# Patient Record
Sex: Male | Born: 1965
Health system: Southern US, Community
[De-identification: ages and names within clinical notes are randomized; demographics above are authoritative.]

## PROBLEM LIST (undated history)

## (undated) DIAGNOSIS — M545 Low back pain, unspecified: Secondary | ICD-10-CM

## (undated) DIAGNOSIS — T7840XA Allergy, unspecified, initial encounter: Secondary | ICD-10-CM

## (undated) DIAGNOSIS — D126 Benign neoplasm of colon, unspecified: Secondary | ICD-10-CM

## (undated) DIAGNOSIS — I10 Essential (primary) hypertension: Secondary | ICD-10-CM

## (undated) DIAGNOSIS — K635 Polyp of colon: Secondary | ICD-10-CM

## (undated) HISTORY — DX: Polyp of colon: K63.5

## (undated) HISTORY — DX: Low back pain, unspecified: M54.50

## (undated) HISTORY — PX: LUMBAR EPIDURAL INJECTION: SHX1980

## (undated) HISTORY — DX: Allergy, unspecified, initial encounter: T78.40XA

## (undated) HISTORY — DX: Low back pain: M54.5

## (undated) HISTORY — DX: Essential (primary) hypertension: I10

## (undated) HISTORY — DX: Benign neoplasm of colon, unspecified: D12.6

---

## 2006-08-05 ENCOUNTER — Ambulatory Visit: Payer: Self-pay | Admitting: Internal Medicine

## 2006-08-05 DIAGNOSIS — R300 Dysuria: Secondary | ICD-10-CM | POA: Insufficient documentation

## 2006-08-05 LAB — CONVERTED CEMR LAB
Basophils Absolute: 0 10*3/uL (ref 0.0–0.1)
Basophils Relative: 0.1 % (ref 0.0–1.0)
Eosinophil percent: 0.7 % (ref 0.0–5.0)
HCT: 47.8 % (ref 39.0–52.0)
Hemoglobin: 16.6 g/dL (ref 13.0–17.0)
Lymphocytes Relative: 14.4 % (ref 12.0–46.0)
MCHC: 34.7 g/dL (ref 30.0–36.0)
MCV: 89.7 fL (ref 78.0–100.0)
Monocytes Absolute: 0.5 10*3/uL (ref 0.2–0.7)
Monocytes Relative: 5.1 % (ref 3.0–11.0)
Neutro Abs: 7 10*3/uL (ref 1.4–7.7)
Neutrophils Relative %: 79.7 % — ABNORMAL HIGH (ref 43.0–77.0)
Platelets: 310 10*3/uL (ref 150–400)
RBC: 5.32 M/uL (ref 4.22–5.81)
RDW: 12.3 % (ref 11.5–14.6)
WBC: 8.9 10*3/uL (ref 4.5–10.5)

## 2007-02-02 ENCOUNTER — Ambulatory Visit: Payer: Self-pay | Admitting: Internal Medicine

## 2007-05-28 ENCOUNTER — Telehealth: Payer: Self-pay | Admitting: Internal Medicine

## 2007-07-28 ENCOUNTER — Ambulatory Visit: Payer: Self-pay | Admitting: Internal Medicine

## 2007-07-28 DIAGNOSIS — M549 Dorsalgia, unspecified: Secondary | ICD-10-CM | POA: Insufficient documentation

## 2007-07-28 DIAGNOSIS — G4700 Insomnia, unspecified: Secondary | ICD-10-CM

## 2007-07-28 DIAGNOSIS — J069 Acute upper respiratory infection, unspecified: Secondary | ICD-10-CM | POA: Insufficient documentation

## 2007-10-05 ENCOUNTER — Encounter: Payer: Self-pay | Admitting: Internal Medicine

## 2008-01-14 ENCOUNTER — Ambulatory Visit: Payer: Self-pay | Admitting: Internal Medicine

## 2008-01-14 LAB — CONVERTED CEMR LAB
ALT: 36 units/L (ref 0–53)
AST: 23 units/L (ref 0–37)
Basophils Absolute: 0 10*3/uL (ref 0.0–0.1)
Basophils Relative: 0.3 % (ref 0.0–1.0)
CO2: 31 meq/L (ref 19–32)
CRP, High Sensitivity: <1 — ABNORMAL LOW (ref 0.00–5.00)
Calcium: 9 mg/dL (ref 8.4–10.5)
Chloride: 98 meq/L (ref 96–112)
Creatinine, Ser: 1.1 mg/dL (ref 0.4–1.5)
Eosinophils Relative: 0.6 % (ref 0.0–5.0)
Glucose, Bld: 114 mg/dL — ABNORMAL HIGH (ref 70–99)
Hemoglobin: 16.4 g/dL (ref 13.0–17.0)
Ketones, ur: NEGATIVE mg/dL
LDL Cholesterol: 120 mg/dL — ABNORMAL HIGH (ref 0–99)
Leukocytes, UA: NEGATIVE
Lymphocytes Relative: 16.2 % (ref 12.0–46.0)
MCHC: 34.1 g/dL (ref 30.0–36.0)
Monocytes Relative: 6.9 % (ref 3.0–12.0)
Neutro Abs: 8 10*3/uL — ABNORMAL HIGH (ref 1.4–7.7)
Neutrophils Relative %: 76 % (ref 43.0–77.0)
Nitrite: NEGATIVE
RBC: 5.32 M/uL (ref 4.22–5.81)
Specific Gravity, Urine: 1.01 (ref 1.000–1.03)
Total Bilirubin: 1.1 mg/dL (ref 0.3–1.2)
Total CHOL/HDL Ratio: 4.5
Total Protein: 7.5 g/dL (ref 6.0–8.3)
Triglycerides: 127 mg/dL (ref 0–149)
Urobilinogen, UA: 0.2 (ref 0.0–1.0)
WBC: 10.5 10*3/uL (ref 4.5–10.5)
pH: 5.5 (ref 5.0–8.0)

## 2008-01-17 ENCOUNTER — Ambulatory Visit: Payer: Self-pay | Admitting: Internal Medicine

## 2008-01-17 DIAGNOSIS — R7309 Other abnormal glucose: Secondary | ICD-10-CM | POA: Insufficient documentation

## 2008-01-17 DIAGNOSIS — I10 Essential (primary) hypertension: Secondary | ICD-10-CM | POA: Insufficient documentation

## 2008-01-18 ENCOUNTER — Encounter: Payer: Self-pay | Admitting: Internal Medicine

## 2008-03-20 ENCOUNTER — Ambulatory Visit: Payer: Self-pay | Admitting: Internal Medicine

## 2008-06-07 ENCOUNTER — Ambulatory Visit: Payer: Self-pay | Admitting: Internal Medicine

## 2008-06-07 ENCOUNTER — Telehealth: Payer: Self-pay | Admitting: Internal Medicine

## 2008-06-07 LAB — CONVERTED CEMR LAB
BUN: 14 mg/dL (ref 6–23)
CO2: 30 meq/L (ref 19–32)
Calcium: 9.1 mg/dL (ref 8.4–10.5)
Chloride: 106 meq/L (ref 96–112)
Creatinine, Ser: 1 mg/dL (ref 0.4–1.5)
GFR calc non Af Amer: 88 mL/min

## 2008-06-12 ENCOUNTER — Ambulatory Visit: Payer: Self-pay | Admitting: Internal Medicine

## 2008-06-12 DIAGNOSIS — L259 Unspecified contact dermatitis, unspecified cause: Secondary | ICD-10-CM

## 2008-08-23 ENCOUNTER — Ambulatory Visit: Payer: Self-pay | Admitting: Internal Medicine

## 2008-08-23 DIAGNOSIS — H6980 Other specified disorders of Eustachian tube, unspecified ear: Secondary | ICD-10-CM

## 2008-08-23 DIAGNOSIS — S139XXA Sprain of joints and ligaments of unspecified parts of neck, initial encounter: Secondary | ICD-10-CM

## 2008-08-25 ENCOUNTER — Telehealth: Payer: Self-pay | Admitting: Internal Medicine

## 2008-09-21 ENCOUNTER — Ambulatory Visit: Payer: Self-pay | Admitting: Internal Medicine

## 2008-09-21 DIAGNOSIS — R5381 Other malaise: Secondary | ICD-10-CM

## 2008-09-21 DIAGNOSIS — R5383 Other fatigue: Secondary | ICD-10-CM

## 2008-09-21 LAB — CONVERTED CEMR LAB
Basophils Absolute: 0 10*3/uL (ref 0.0–0.1)
Bilirubin, Direct: 0.2 mg/dL (ref 0.0–0.3)
Calcium: 9.5 mg/dL (ref 8.4–10.5)
Free T4: 0.9 ng/dL (ref 0.6–1.6)
GFR calc Af Amer: 95 mL/min
GFR calc non Af Amer: 79 mL/min
HCT: 47.3 % (ref 39.0–52.0)
Hemoglobin: 16.6 g/dL (ref 13.0–17.0)
MCHC: 35.2 g/dL (ref 30.0–36.0)
Monocytes Absolute: 0.9 10*3/uL (ref 0.1–1.0)
Neutro Abs: 4.7 10*3/uL (ref 1.4–7.7)
Platelets: 238 10*3/uL (ref 150–400)
RDW: 12.2 % (ref 11.5–14.6)
Rhuematoid fact SerPl-aCnc: <20 intl units/mL — ABNORMAL LOW (ref 0.0–20.0)
Sodium: 140 meq/L (ref 135–145)
TSH: 1.72 microintl units/mL (ref 0.35–5.50)
Total Bilirubin: 1.5 mg/dL — ABNORMAL HIGH (ref 0.3–1.2)

## 2008-09-22 ENCOUNTER — Encounter: Payer: Self-pay | Admitting: Internal Medicine

## 2008-09-22 LAB — CONVERTED CEMR LAB: Anti Nuclear Antibody(ANA): NEGATIVE

## 2008-09-25 ENCOUNTER — Telehealth (INDEPENDENT_AMBULATORY_CARE_PROVIDER_SITE_OTHER): Payer: Self-pay | Admitting: *Deleted

## 2008-11-20 ENCOUNTER — Ambulatory Visit: Payer: Self-pay | Admitting: Internal Medicine

## 2008-11-20 DIAGNOSIS — J309 Allergic rhinitis, unspecified: Secondary | ICD-10-CM | POA: Insufficient documentation

## 2008-11-20 DIAGNOSIS — H6690 Otitis media, unspecified, unspecified ear: Secondary | ICD-10-CM | POA: Insufficient documentation

## 2009-09-20 ENCOUNTER — Telehealth: Payer: Self-pay | Admitting: Internal Medicine

## 2009-11-15 ENCOUNTER — Telehealth: Payer: Self-pay | Admitting: Internal Medicine

## 2009-11-19 ENCOUNTER — Ambulatory Visit: Payer: Self-pay | Admitting: Family

## 2009-11-19 DIAGNOSIS — S81809A Unspecified open wound, unspecified lower leg, initial encounter: Secondary | ICD-10-CM

## 2009-11-19 DIAGNOSIS — S81009A Unspecified open wound, unspecified knee, initial encounter: Secondary | ICD-10-CM | POA: Insufficient documentation

## 2009-11-19 DIAGNOSIS — S91009A Unspecified open wound, unspecified ankle, initial encounter: Secondary | ICD-10-CM

## 2010-09-10 NOTE — Assessment & Plan Note (Signed)
Summary: scrape on leg that is not healing well/dt   Vital Signs:  Patient profile:   46 year old male Height:      71 inches Weight:      231.75 pounds BMI:     32.44 Temp:     98.0 degrees F oral Pulse rate:   90 / minute Pulse rhythm:   regular Resp:     18 per minute BP sitting:   124 / 90  (right arm) Cuff size:   large  Vitals Entered By: Mervin Kung CMA (November 19, 2009 10:36 AM) CC: room 4   Injured leg at work.  Wants to make sure that leg is healing properly. Comments Pt. got tetanus and antibiotic from doctor out of town last Thursday.   Primary Care Provider:  Dondra Spry DO  CC:  room 4   Injured leg at work.  Wants to make sure that leg is healing properly.Joseph Gibbs  History of Present Illness: Mr Joseph Gibbs is a 45 year old male who presents today following injury at work last Thursday- fell down some stairs and scraped his leg on a fence.  Patient was seen at an urgent care and was given samples of an antibiotic which he took for 4 days (unsure of name).  Also received a Tetanus booster.  Patient has been applying triple antibiotic ointment, not feeling like it has been healing as it should.   Allergies: 1)  Sulfa  Physical Exam  General:  Well-developed,well-nourished,in no acute distress; alert,appropriate and cooperative throughout examination Msk:  red scrape down left shin without significant erythema.  + deeper open wound noted at bottom of scrape- some yellow granulation tissue noted in this wound without odor or drainage.   + puncture wound with central scabbing noted lower on left shin.   mild echymosis noted beneath left ankle.  Psych:  Cognition and judgment appear intact. Alert and cooperative with normal attention span and concentration. No apparent delusions, illusions, hallucinations   Impression & Recommendations:  Problem # 1:  WOUND, OPEN, LEG, WITHOUT COMPLICATION (ICD-891.0) Assessment New Wound appears to be healing appropriately.  No sign  of associated cellulitis noted. Will plan to continue antibiotics (will use Augmentin).  Plan follow up in 1 week.     Problem # 2:  INSOMNIA (ICD-780.52) Assessment: Unchanged Patient is requesting a refill on his Remus Loffler which he uses when he travels on as needed basis. His updated medication list for this problem includes:    Ambien 5 Mg Tabs (Zolpidem tartrate) .Joseph Gibbs... 1/2-1 by mouth at bedtime as needed.  Complete Medication List: 1)  Ambien 5 Mg Tabs (Zolpidem tartrate) .... 1/2-1 by mouth at bedtime as needed. 2)  Ibuprofen 200 Mg Tabs (Ibuprofen) .... Take 2 as needed. 3)  Augmentin 500-125 Mg Tabs (Amoxicillin-pot clavulanate) .... One tablet by mouth two times a day x 6 days  Patient Instructions: 1)  Call if drainage, swelling or fever. 2)  Follow up in 1 week. Prescriptions: AMBIEN 5 MG TABS (ZOLPIDEM TARTRATE) 1/2-1 by mouth at bedtime as needed.  #20 x 0   Entered and Authorized by:   Lemont Fillers FNP   Signed by:   Lemont Fillers FNP on 11/19/2009   Method used:   Print then Give to Patient   RxID:   [id number] AUGMENTIN 500-125 MG TABS (AMOXICILLIN-POT CLAVULANATE) one tablet by mouth two times a day x 6 days  #12 x 0   Entered and Authorized by:  Lemont Fillers FNP   Signed by:   Lemont Fillers FNP on 11/19/2009   Method used:   [phone] ([facility]       [address]       [place], [place]  [address]       Ph: [phone] or [phone]       Fax: [phone]   RxID:   [id number]   Current Allergies (reviewed today): SULFA   Immunization History:  Tetanus/Td Immunization History:    Tetanus/Td:  historical (11/15/2009)

## 2010-09-10 NOTE — Progress Notes (Signed)
Summary: Out of town, needs date of last tetanus  Phone Note Call from Patient Call back at [employer] [phone]   Caller: Patient Call For: YOO  Summary of Call: HE CUT HIS LEG ON SOME METAL.  WHEN WAS HIS LAST TETNUS  Initial call taken by: Roselle Locus,  November 15, 2009 10:14 AM  Follow-up for Phone Call        Spoke to pt. and he states he is in [place] and he has a small puncture wound on his leg from metal he was working with.  Advised pt. his last tetanus was 12/28/2006.  Pt states he feels his leg is better today.  Advised pt. to see someone in [place] to assess for infection or need for additional tetanus booster.  Pt. voices understanding.  Mervin Kung CMA  November 15, 2009 10:28 AM

## 2010-09-10 NOTE — Progress Notes (Signed)
Summary: Medical question from patient  Phone Note Call from Patient   Caller: Patient Summary of Call: Call pt. back at #[phone] Pt's daughter was diagnosed with the flu at her Dr.'s office and they told the father he should call his primary dr. and be started on the preventive pack med.... Does he need a appointment for this or can this be called in?  Initial call taken by: Michaelle Copas,  September 20, 2009 10:55 AM  Follow-up for Phone Call        see rx.   Follow-up by: D. Thomos Lemons DO,  September 20, 2009 12:05 PM  Additional Follow-up for Phone Call Additional follow up Details #1::        informed pt. that Tamiflu has been called in Additional Follow-up by: Michaelle Copas,  September 20, 2009 1:54 PM    New/Updated Medications: TAMIFLU 75 MG CAPS (OSELTAMIVIR PHOSPHATE) one by mouth once daily Prescriptions: TAMIFLU 75 MG CAPS (OSELTAMIVIR PHOSPHATE) one by mouth once daily  #10 x 0   Entered and Authorized by:   D. Thomos Lemons DO   Signed by:   D. Thomos Lemons DO on 09/20/2009   Method used:   [facility]        [facility]       [address]       [place], [place]  [address]       Ph: [phone] or [phone]       Fax: [phone]   RxID:   [id number]

## 2011-02-25 ENCOUNTER — Ambulatory Visit (INDEPENDENT_AMBULATORY_CARE_PROVIDER_SITE_OTHER): Payer: BC Managed Care – PPO | Admitting: Internal Medicine

## 2011-02-25 ENCOUNTER — Ambulatory Visit: Payer: BC Managed Care – PPO | Admitting: Gastroenterology

## 2011-02-25 ENCOUNTER — Encounter: Payer: Self-pay | Admitting: Internal Medicine

## 2011-02-25 ENCOUNTER — Encounter: Payer: Self-pay | Admitting: Gastroenterology

## 2011-02-25 VITALS — BP 130/84 | HR 85 | Ht 71.0 in | Wt 219.0 lb

## 2011-02-25 DIAGNOSIS — K625 Hemorrhage of anus and rectum: Secondary | ICD-10-CM

## 2011-02-25 LAB — CBC WITH DIFFERENTIAL/PLATELET
Basophils Absolute: 0 10*3/uL (ref 0.0–0.1)
Basophils Relative: 0 % (ref 0–1)
HCT: 46.5 % (ref 39.0–52.0)
MCHC: 34.8 g/dL (ref 30.0–36.0)
Monocytes Absolute: 0.6 10*3/uL (ref 0.1–1.0)
Neutro Abs: 4.4 10*3/uL (ref 1.7–7.7)
RDW: 12.6 % (ref 11.5–15.5)

## 2011-02-25 NOTE — Progress Notes (Signed)
  Subjective:    Patient ID: Joseph Gibbs, male    DOB: Dec 13, 1965, 45 y.o.   MRN: [id number]  HPI Pt presents to clinic for evaluation rectal bleeding. Yesterday noted two occasion with BRBPR with BM. No associated constipation, abdominal pain and denies fam hx of colon cancer or IBD. Rectum area has been tender and irritated but no h/o hemorrhoid. Denies any further bleeding since last night.  Notes has had intermittent brbpr x ~63months.  No alleviating or exacerbating factors.  No other complaints.  Reviewed pmh, medications and allergies    Review of Systems  Constitutional: Negative for fever and unexpected weight change.  Gastrointestinal: Positive for blood in stool, anal bleeding and rectal pain. Negative for nausea, vomiting, abdominal pain and constipation.  Skin: Negative for rash.       Objective:   Physical Exam  Nursing note and vitals reviewed. Constitutional: He appears well-developed and well-nourished. No distress.  HENT:  Head: Normocephalic and atraumatic.  Right Ear: External ear normal.  Left Ear: External ear normal.  Eyes: Conjunctivae are normal. No scleral icterus.  Genitourinary: Rectum normal. Rectal exam shows no external hemorrhoid, no fissure, no mass, no tenderness and anal tone normal. Guaiac negative stool.  Neurological: He is alert.  Skin: Skin is dry. He is not diaphoretic.  Psychiatric: He has a normal mood and affect.          Assessment & Plan:

## 2011-02-26 ENCOUNTER — Telehealth: Payer: Self-pay | Admitting: Internal Medicine

## 2011-02-26 NOTE — Telephone Encounter (Signed)
Pt notified of labs.  Pt voices understanding and is agreeable with plan.  

## 2011-02-26 NOTE — Telephone Encounter (Signed)
Patient would like lab results. Per patient, you may leave a detailed message.

## 2011-03-02 NOTE — Assessment & Plan Note (Addendum)
Cannot exclude proximal source. Obtain CBC and schedule GI consult

## 2011-03-12 DIAGNOSIS — D126 Benign neoplasm of colon, unspecified: Secondary | ICD-10-CM

## 2011-03-12 HISTORY — DX: Benign neoplasm of colon, unspecified: D12.6

## 2011-03-31 ENCOUNTER — Ambulatory Visit (INDEPENDENT_AMBULATORY_CARE_PROVIDER_SITE_OTHER): Payer: BC Managed Care – PPO | Admitting: Internal Medicine

## 2011-03-31 ENCOUNTER — Encounter: Payer: Self-pay | Admitting: Internal Medicine

## 2011-03-31 ENCOUNTER — Ambulatory Visit (HOSPITAL_BASED_OUTPATIENT_CLINIC_OR_DEPARTMENT_OTHER)
Admission: RE | Admit: 2011-03-31 | Discharge: 2011-03-31 | Disposition: A | Discharge status: Home / Self Care | Payer: BC Managed Care – PPO | Source: Ambulatory Visit | Attending: Internal Medicine | Admitting: Internal Medicine

## 2011-03-31 VITALS — BP 108/80 | HR 75 | Temp 98.1°F | Resp 16 | Ht 71.0 in

## 2011-03-31 DIAGNOSIS — M549 Dorsalgia, unspecified: Secondary | ICD-10-CM

## 2011-03-31 DIAGNOSIS — R059 Cough, unspecified: Secondary | ICD-10-CM | POA: Insufficient documentation

## 2011-03-31 DIAGNOSIS — R05 Cough: Secondary | ICD-10-CM

## 2011-03-31 DIAGNOSIS — R079 Chest pain, unspecified: Secondary | ICD-10-CM | POA: Insufficient documentation

## 2011-03-31 MED ORDER — DICLOFENAC SODIUM 75 MG PO TBEC: 75.0000 mg | DELAYED_RELEASE_TABLET | Freq: Two times a day (BID) | ORAL | Status: DC | PRN

## 2011-03-31 MED ORDER — AZITHROMYCIN 250 MG PO TABS: ORAL_TABLET | ORAL | Status: AC

## 2011-03-31 NOTE — Assessment & Plan Note (Signed)
Obtain cxr pa/lat. Begin abx course. Followup if no improvement or worsening.

## 2011-03-31 NOTE — Assessment & Plan Note (Signed)
Begin voltaren bid prn with food and no other nsaids. Followup if no improvement or worsening.

## 2011-03-31 NOTE — Progress Notes (Signed)
  Subjective:    Patient ID: Joseph Gibbs, male    DOB: 08/16/1965, 45 y.o.   MRN: [id number]  HPI Pt presents to clinic as a work in for evaluation of  Cough. Notes 1 month h/o cough productive for yellow sputum without hemoptysis, fever or chills. Has 1-2 wk h/o feeling a little harder to breath. Denies true dyspnea. States feels like does when he has a URI and sometimes burns. Sometimes wakes up at night with the sensation. Bothers sleep and wonders if needs to resume ambien prn. No known sick exposure. Taking no medication for the problem. Has h/o chronic intermittent lbp. Recent flare noted with left lbp without leg weakness, paresthesia, injury or trauma. Pain is better over last 48 hours. No other complaints.   Past Medical History  Diagnosis Date  . Elevated BP     w/o mention of hypertension  . Low back pain    Past Surgical History  Procedure Date  . Lumbar epidural injection     [facility]    reports that he has never smoked. He has never used smokeless tobacco. He reports that he drinks alcohol. His drug history not on file. family history includes Cancer in his father; Cancer (age of onset:59) in his mother; and Diabetes in his other. Allergies  Allergen Reactions  . Sulfonamide Derivatives     REACTION: Flushing and sweating    Review of Systems see hpi     Objective:   Physical Exam Physical Exam  Nursing note and vitals reviewed. Constitutional: Appears well-developed and well-nourished. No distress.  HENT:  Head: Normocephalic and atraumatic.  Right Ear: External ear normal.  Left Ear: External ear normal.  Eyes: Conjunctivae are normal. No scleral icterus.  Neck: Neck supple.  Cardiovascular: Normal rate, regular rhythm and normal heart sounds.  Exam reveals no gallop and no friction rub.   No murmur heard. Pulmonary/Chest: Effort normal and breath sounds normal. No respiratory distress. He has no wheezes. no rales.  Lymphadenopathy:    He  has no cervical adenopathy.  Neurological:Alert.  Skin: Skin is warm and dry. Not diaphoretic.  Psychiatric: Has a normal mood and affect.        Assessment & Plan:

## 2011-04-02 ENCOUNTER — Ambulatory Visit (INDEPENDENT_AMBULATORY_CARE_PROVIDER_SITE_OTHER): Payer: BC Managed Care – PPO | Admitting: Gastroenterology

## 2011-04-02 ENCOUNTER — Encounter: Payer: Self-pay | Admitting: Gastroenterology

## 2011-04-02 VITALS — BP 142/80 | HR 68 | Ht 71.0 in | Wt 219.0 lb

## 2011-04-02 DIAGNOSIS — K6289 Other specified diseases of anus and rectum: Secondary | ICD-10-CM

## 2011-04-02 DIAGNOSIS — K921 Melena: Secondary | ICD-10-CM

## 2011-04-02 MED ORDER — PRAMOXINE-HC 1-2.5 % EX CREA: TOPICAL_CREAM | CUTANEOUS | Status: DC

## 2011-04-02 NOTE — Patient Instructions (Addendum)
You have been scheduled for a Colonoscopy.  See separate instructions. SuPrep sample kit given.  Samples of Analpram given.  Use twice a day as needed.  Rectal care instructions given.  cc:  Clifton Custard., MD

## 2011-04-02 NOTE — Progress Notes (Signed)
History of Present Illness: This is a 45 year old male who relates frequent problems with anal itching and anal discomfort for the past several months. He also notes small amounts of bright red blood on the tissue paper after wiping. She had one episode with large amounts of red stool that occurred on one day and has not recurred. He now notes that on the day prior to the red stool drank a large amount of concentrated beet juice. Denies weight loss, abdominal pain, constipation, diarrhea, change in stool caliber, melena, nausea, vomiting, dysphagia, reflux symptoms, chest pain.  Past Medical History  Diagnosis Date  . Elevated BP     w/o mention of hypertension  . Low back pain    Past Surgical History  Procedure Date  . Lumbar epidural injection     [facility]    reports that he has never smoked. He has never used smokeless tobacco. He reports that he drinks alcohol. He reports that he does not use illicit drugs. family history includes Breast cancer (age of onset:59) in his mother; Cancer in his father; and Diabetes in his other.  There is no history of Colon cancer. Allergies  Allergen Reactions  . Sulfonamide Derivatives     REACTION: Flushing and sweating   Outpatient Encounter Prescriptions as of 04/02/2011  Medication Sig Dispense Refill  . azithromycin (ZITHROMAX Z-PAK) 250 MG tablet Take 2 tablets (500 mg) on  Day 1,  followed by 1 tablet (250 mg) once daily on Days 2 through 5.  6 each  0  . diclofenac (VOLTAREN) 75 MG EC tablet Take 1 tablet (75 mg total) by mouth 2 (two) times daily as needed.  30 tablet  1  . pramoxine-hydrocortisone cream Apply to affected area twice a day as needed  30 g  1   Review of Systems: Back pain. Pertinent positive and negative review of systems were noted in the above HPI section. All other review of systems were otherwise negative.  Physical Exam: General: Well developed , well nourished, no acute distress Head: Normocephalic and  atraumatic Eyes:  sclerae anicteric, EOMI Ears: Normal auditory acuity Mouth: No deformity or lesions Neck: Supple, no masses or thyromegaly Lungs: Clear throughout to auscultation Heart: Regular rate and rhythm; no murmurs, rubs or bruits Abdomen: Soft, non tender and non distended. No masses, hepatosplenomegaly or hernias noted. Normal Bowel sounds Rectal: Small external hemorrhoidal tags no internal lesions Hemoccult negative brown stool in the vault. Musculoskeletal: Symmetrical with no gross deformities  Skin: No lesions on visible extremities Pulses:  Normal pulses noted Extremities: No clubbing, cyanosis, edema or deformities noted Neurological: Alert oriented x 4, grossly nonfocal Cervical Nodes:  No significant cervical adenopathy Inguinal Nodes: No significant inguinal adenopathy Psychological:  Alert and cooperative. Normal mood and affect  Assessment and Recommendations:  1. Hematochezia, anal pain and anal itching. I suspect his symptoms are related to the small external hemorrhoids and internal hemorrhoids. Begin standard rectal care instructions and Analpram cream. The single episode of a large volume of red stool and will may have been related to beet juice or may be gastrointestinal bleeding. Further evaluation with colonoscopy. The risks, benefits, and alternatives to colonoscopy with possible biopsy and possible polypectomy were discussed with the patient and they consent to proceed.

## 2011-04-03 ENCOUNTER — Encounter: Payer: Self-pay | Admitting: Gastroenterology

## 2011-04-03 ENCOUNTER — Ambulatory Visit (AMBULATORY_SURGERY_CENTER): Payer: BC Managed Care – PPO | Admitting: Gastroenterology

## 2011-04-03 DIAGNOSIS — Z1211 Encounter for screening for malignant neoplasm of colon: Secondary | ICD-10-CM

## 2011-04-03 DIAGNOSIS — K5289 Other specified noninfective gastroenteritis and colitis: Secondary | ICD-10-CM

## 2011-04-03 DIAGNOSIS — K6289 Other specified diseases of anus and rectum: Secondary | ICD-10-CM

## 2011-04-03 DIAGNOSIS — D126 Benign neoplasm of colon, unspecified: Secondary | ICD-10-CM

## 2011-04-03 DIAGNOSIS — K921 Melena: Secondary | ICD-10-CM

## 2011-04-03 MED ORDER — SODIUM CHLORIDE 0.9 % IV SOLN: 500.0000 mL | INTRAVENOUS | Status: DC

## 2011-04-03 NOTE — Patient Instructions (Signed)
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 Dr Russella Dar said this was probably a result of taking certain medications. Polyp removed today.  You will receive a letter detailing the pathology of polyp.  Please refer to hand-out on polyps.  Next colonoscopy will be dated either 5 or 10 years depending upon this pathology. Small internal hemorrhoids noted. NO ASPIRIN or aspirin products for 2 weeks. Please refer to blue and green discharge instruction sheets.

## 2011-04-04 ENCOUNTER — Telehealth: Payer: Self-pay | Admitting: Internal Medicine

## 2011-04-04 ENCOUNTER — Ambulatory Visit: Payer: BC Managed Care – PPO | Admitting: Internal Medicine

## 2011-04-04 ENCOUNTER — Telehealth: Payer: Self-pay

## 2011-04-04 NOTE — Telephone Encounter (Signed)
Follow up Call- Patient questions:  Do you have a fever, pain , or abdominal swelling? yes Pain Score  3 *  Have you tolerated food without any problems? yes  Have you been able to return to your normal activities? yes  Do you have any questions about your discharge instructions: Diet   no Medications  no Follow up visit  no  Do you have questions or concerns about your Care? no  Actions: * If pain score is 4 or above:  The pt c/o headache last night and work up with nagging headache this am.  He said he was going to eat breakfast and take something for the headache.  I advised him to call us mid day if the headache did not resolve.  He said he would. MAW No action needed, pain <4.

## 2011-04-04 NOTE — Telephone Encounter (Signed)
Patient called back and made appt for this afternoon.

## 2011-04-04 NOTE — Telephone Encounter (Signed)
Patient called back and cancelled appt for this afternoon but would still like a nurse to call him back.

## 2011-04-04 NOTE — Telephone Encounter (Signed)
agree

## 2011-04-04 NOTE — Telephone Encounter (Signed)
Patient states that he was seen on 8-20 by Dr. Rodena Medin. He had a chest x-ray done. Patient states that he is still having bruising on right side near lung. Patient wants to know if he should be concerned.

## 2011-04-04 NOTE — Telephone Encounter (Signed)
Call returned to patient [phone], still has some tenderness, does not remember if he rammed something into it.  He is concerned the area was getting larger. He was reassured the chest xray was negative, and did not suggest any internal bleeding. He stated that he did have a colonoscopy and it was noticed by the doctor, but there was no concern. Again patient reassured that bruises tend get larger as they heal, and appear yellow in color. He stated that seems to be occurring with him. He was satisfied and will follow up if needed.

## 2011-04-08 ENCOUNTER — Encounter: Payer: Self-pay | Admitting: Gastroenterology

## 2012-03-30 ENCOUNTER — Encounter: Payer: Self-pay | Admitting: Internal Medicine

## 2012-03-30 ENCOUNTER — Ambulatory Visit (INDEPENDENT_AMBULATORY_CARE_PROVIDER_SITE_OTHER): Payer: BC Managed Care – PPO | Admitting: Internal Medicine

## 2012-03-30 VITALS — BP 140/96 | HR 88 | Temp 98.4°F | Wt 229.0 lb

## 2012-03-30 DIAGNOSIS — I10 Essential (primary) hypertension: Secondary | ICD-10-CM

## 2012-03-30 DIAGNOSIS — B350 Tinea barbae and tinea capitis: Secondary | ICD-10-CM

## 2012-03-30 MED ORDER — GRISEOFULVIN MICROSIZE 500 MG PO TABS: 250.0000 mg | ORAL_TABLET | Freq: Two times a day (BID) | ORAL | Status: AC

## 2012-03-30 MED ORDER — CLOTRIMAZOLE-BETAMETHASONE 1-0.05 % EX CREA: TOPICAL_CREAM | Freq: Two times a day (BID) | CUTANEOUS | Status: DC

## 2012-03-30 NOTE — Progress Notes (Signed)
  Subjective:    Patient ID: Joseph Gibbs, male    DOB: May 21, 1966, 46 y.o.   MRN: [id number]  HPI  46 year old white male complains of chronic recurring rash on his posterior scalp. As previously seen by dermatologist and treated with clobetasol foam. His symptoms improved but then recurred. His symptoms have been on and off for last 8-10 years. Area is scaly, pruritic and irritated.  Patient has history of elevated blood pressure readings without diagnosis of hypertension. He has been monitoring his blood pressure as an outpatient sporadically. Systolic blood pressure readings are usually in the 140s and diastolic in the 90s. He is not following a low salt diet and exercising only occasionally. Review of Systems No fever  Past Medical History  Diagnosis Date  . Elevated BP     w/o mention of hypertension  . Low back pain   . Allergy   . Hypertension     History   Social History  . Marital Status: Married    Spouse Name: N/A    Number of Children: 1  . Years of Education: N/A   Occupational History  . 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 Smoking status: Never Smoker   . Smokeless tobacco: Never Used  . Alcohol Use: 2.5 oz/week    5 drink(s) per week     one drink daily   . Drug Use: No  . Sexually Active: Not on file   Other Topics Concern  . Not on file   Social History Narrative   Caffeine drinks daily but not soda     Past Surgical History  Procedure Date  . Lumbar epidural injection     [facility]    Family History  Problem Relation Age of Onset  . Breast cancer Mother 33  . Cancer Father     ? history of throat cancer  . Diabetes Other   . Colon cancer Neg Hx     Allergies  Allergen Reactions  . Sulfonamide Derivatives     REACTION: Flushing and sweating    No current outpatient prescriptions on file prior to visit.    BP 140/96  Pulse 88  Temp 98.4 F (36.9 C) (Oral)  Wt 229 lb (103.874  kg)       Objective:   Physical Exam  Constitutional: He appears well-developed and well-nourished.  Cardiovascular: Normal rate and normal heart sounds.   Pulmonary/Chest: Effort normal and breath sounds normal. He has no wheezes.  Skin:       Dry scaly patch with erythema (posterior scalp)          Assessment & Plan:

## 2012-03-30 NOTE — Patient Instructions (Addendum)
Please complete the following lab tests before your next follow up appointment: BMET - 401.9 LFTs, Lipid panel - 401.9

## 2012-03-30 NOTE — Assessment & Plan Note (Signed)
46 year old white male with chronic rash on posterior scalp. His symptoms have been on and off for last 8-10 years. Rash improved with use of clobetasol foam in the past but symptoms always return. I suspect patient's symptoms are secondary to tinea capitis. Treat with griseofulvin 250 mg twice daily for 6 weeks. Use lotrisone cream as directed for 2 weeks.Marland Kitchen

## 2012-03-30 NOTE — Assessment & Plan Note (Signed)
Continue to monitor BP at home.  Lifestyle changes reviewed.  If persistent mild elevation, consider start antihypertensive at next OV.

## 2012-05-07 ENCOUNTER — Ambulatory Visit: Payer: BC Managed Care – PPO | Admitting: Internal Medicine

## 2012-05-28 ENCOUNTER — Encounter: Payer: Self-pay | Admitting: Internal Medicine

## 2012-05-28 ENCOUNTER — Ambulatory Visit (INDEPENDENT_AMBULATORY_CARE_PROVIDER_SITE_OTHER): Payer: BC Managed Care – PPO | Admitting: Internal Medicine

## 2012-05-28 VITALS — BP 140/104 | HR 84 | Temp 98.7°F | Wt 225.0 lb

## 2012-05-28 DIAGNOSIS — R6882 Decreased libido: Secondary | ICD-10-CM | POA: Insufficient documentation

## 2012-05-28 DIAGNOSIS — B350 Tinea barbae and tinea capitis: Secondary | ICD-10-CM

## 2012-05-28 DIAGNOSIS — R110 Nausea: Secondary | ICD-10-CM

## 2012-05-28 DIAGNOSIS — R5381 Other malaise: Secondary | ICD-10-CM

## 2012-05-28 DIAGNOSIS — R1031 Right lower quadrant pain: Secondary | ICD-10-CM | POA: Insufficient documentation

## 2012-05-28 DIAGNOSIS — R5383 Other fatigue: Secondary | ICD-10-CM

## 2012-05-28 LAB — T4, FREE: Free T4: 0.81 ng/dL (ref 0.60–1.60)

## 2012-05-28 LAB — CBC WITH DIFFERENTIAL/PLATELET
Basophils Relative: 0.4 % (ref 0.0–3.0)
Eosinophils Absolute: 0.1 10*3/uL (ref 0.0–0.7)
Eosinophils Relative: 1.3 % (ref 0.0–5.0)
HCT: 45.5 % (ref 39.0–52.0)
Hemoglobin: 15.2 g/dL (ref 13.0–17.0)
MCHC: 33.5 g/dL (ref 30.0–36.0)
MCV: 89.3 fl (ref 78.0–100.0)
Monocytes Absolute: 0.5 10*3/uL (ref 0.1–1.0)
Neutro Abs: 4.4 10*3/uL (ref 1.4–7.7)
RBC: 5.09 Mil/uL (ref 4.22–5.81)
WBC: 6.4 10*3/uL (ref 4.5–10.5)

## 2012-05-28 LAB — POCT URINALYSIS DIPSTICK
Bilirubin, UA: NEGATIVE
Ketones, UA: NEGATIVE
Leukocytes, UA: NEGATIVE
Spec Grav, UA: 1.025
pH, UA: 7

## 2012-05-28 LAB — HEPATIC FUNCTION PANEL
ALT: 25 U/L (ref 0–53)
Albumin: 3.8 g/dL (ref 3.5–5.2)
Total Protein: 7 g/dL (ref 6.0–8.3)

## 2012-05-28 LAB — BASIC METABOLIC PANEL
CO2: 29 mEq/L (ref 19–32)
Chloride: 102 mEq/L (ref 96–112)
Potassium: 3.8 mEq/L (ref 3.5–5.1)
Sodium: 140 mEq/L (ref 135–145)

## 2012-05-28 MED ORDER — ECONAZOLE NITRATE 1 % EX CREA: TOPICAL_CREAM | Freq: Two times a day (BID) | CUTANEOUS | Status: AC

## 2012-05-28 MED ORDER — AMLODIPINE BESYLATE 2.5 MG PO TABS: 2.5000 mg | ORAL_TABLET | Freq: Every day | ORAL | Status: DC

## 2012-05-28 NOTE — Assessment & Plan Note (Signed)
46 year old white male has mild right lower quadrant pain that radiates into his groin. No obvious hernia on exam. UA shows microscopic hematuria. Discussed possibility that he may be passing small kidney stones. Patient advised to increase fluids. Check CBCD, kidney function, LFTs. Also check renal ultrasound.

## 2012-05-28 NOTE — Assessment & Plan Note (Signed)
Patient complains of decreased libido and struggles to lose weight despite dieting and exercise. Check testosterone levels to rule out hypogonadism.

## 2012-05-28 NOTE — Progress Notes (Signed)
Subjective:    Patient ID: Joseph Gibbs, male    DOB: 01-08-1966, 46 y.o.   MRN: [id number]  HPI  46 year old white male for follow up regarding tinea capitis. Patient also has multiple other complaints. Over the past 2 weeks patient has developed mild right lower quadrant/inguinal discomfort. Symptoms are intermittent. He denies fever or chills. Symptoms are unrelated to food. He notes mild difficulty urinating but no dysuria or gross hematuria.    Patient was prescribed griseofulvin for possible tinea capitis. He discontinued after 2 weeks of treatment do to mild nausea. He has residual scaly area back of his scalp.  Patient complains of inability to lose weight despite eating healthy and exercising on a regular basis. He also complains of low libido.   Review of Systems Mild difficulty urinating, no fever or chills, no flank pain Negative for constipation or diarrhea  Past Medical History  Diagnosis Date  . Elevated BP     w/o mention of hypertension  . Low back pain   . Allergy   . Hypertension     History   Social History  . Marital Status: Married    Spouse Name: N/A    Number of Children: 1  . Years of Education: N/A   Occupational History  . 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 Smoking status: Never Smoker   . Smokeless tobacco: Never Used  . Alcohol Use: 2.5 oz/week    5 drink(s) per week     one drink daily   . Drug Use: No  . Sexually Active: Not on file   Other Topics Concern  . Not on file   Social History Narrative   Caffeine drinks daily but not soda     Past Surgical History  Procedure Date  . Lumbar epidural injection     [facility]    Family History  Problem Relation Age of Onset  . Breast cancer Mother 48  . Cancer Father     ? history of throat cancer  . Diabetes Other   . Colon cancer Neg Hx     Allergies  Allergen Reactions  . Sulfonamide Derivatives     REACTION: Flushing and  sweating    Current Outpatient Prescriptions on File Prior to Visit  Medication Sig Dispense Refill  . amLODipine (NORVASC) 2.5 MG tablet Take 1 tablet (2.5 mg total) by mouth daily.  30 tablet  1  . clotrimazole-betamethasone (LOTRISONE) cream Apply topically 2 (two) times daily.  30 g  1    BP 140/104  Pulse 84  Temp 98.7 F (37.1 C) (Oral)  Wt 225 lb (102.059 kg)       Objective:   Physical Exam  Constitutional: He is oriented to person, place, and time. He appears well-developed and well-nourished.  Cardiovascular: Normal rate, regular rhythm and normal heart sounds.   No murmur heard. Pulmonary/Chest: Effort normal and breath sounds normal. He has no wheezes.       Mild gynecomastia  Abdominal: Soft. Bowel sounds are normal. He exhibits no mass.       Mild right inguinal tenderness Abdominal obesity  Genitourinary:       Normal testicular exam  Musculoskeletal: He exhibits no edema.  Neurological: He is alert and oriented to person, place, and time. A cranial nerve deficit is present.  Psychiatric: He has a normal mood and affect. His behavior is normal.          Assessment &  Plan:

## 2012-05-28 NOTE — Assessment & Plan Note (Signed)
Improved. She was unable to complete full course of griseofulvin due to nausea. Check LFTs. Use econazole cream twice a day for 2 weeks.

## 2012-05-28 NOTE — Patient Instructions (Addendum)
Increase your fluid intake to 6-8 glasses of water per day

## 2012-05-31 LAB — TESTOSTERONE, FREE, TOTAL, SHBG: Testosterone: 307.68 ng/dL (ref 300–890)

## 2012-06-02 ENCOUNTER — Telehealth: Payer: Self-pay | Admitting: *Deleted

## 2012-06-02 NOTE — Telephone Encounter (Signed)
Have pt return for repeat UA POC and with microscopy today.  Depending on results we will decide on need for further testing and/or referral to urologist.

## 2012-06-02 NOTE — Telephone Encounter (Signed)
Pt is out of town today and returning tomorrow.  He will come in tomorrow

## 2012-06-02 NOTE — Telephone Encounter (Signed)
Urine frequency has increased.  He goes every 2 hours and its a full bladder.  Pt said there was some blood in urine and he wants to know if he should be concerned.  [facility]

## 2012-06-03 ENCOUNTER — Other Ambulatory Visit (INDEPENDENT_AMBULATORY_CARE_PROVIDER_SITE_OTHER): Payer: BC Managed Care – PPO

## 2012-06-03 DIAGNOSIS — R300 Dysuria: Secondary | ICD-10-CM

## 2012-06-03 LAB — URINALYSIS, ROUTINE W REFLEX MICROSCOPIC
Leukocytes, UA: NEGATIVE
Specific Gravity, Urine: 1.025 (ref 1.000–1.030)
Urine Glucose: NEGATIVE
Urobilinogen, UA: 0.2 (ref 0.0–1.0)

## 2012-06-03 LAB — POCT URINALYSIS DIPSTICK
Bilirubin, UA: NEGATIVE
Glucose, UA: NEGATIVE
Ketones, UA: NEGATIVE
Nitrite, UA: NEGATIVE

## 2012-06-04 ENCOUNTER — Ambulatory Visit
Admission: RE | Admit: 2012-06-04 | Discharge: 2012-06-04 | Disposition: A | Discharge status: Home / Self Care | Payer: BC Managed Care – PPO | Source: Ambulatory Visit | Attending: Internal Medicine | Admitting: Internal Medicine

## 2012-06-04 ENCOUNTER — Telehealth: Payer: Self-pay | Admitting: Family Medicine

## 2012-06-04 DIAGNOSIS — R1031 Right lower quadrant pain: Secondary | ICD-10-CM

## 2012-06-04 NOTE — Telephone Encounter (Signed)
Per Dr Caryl Never, U/S is normal and so is urine microscopic.  Pt aware but he is still having freq urination.  Told pt I would call back on Monday when Dr Artist Pais returns

## 2012-06-04 NOTE — Telephone Encounter (Signed)
PT had urine test yesterday and ultrasound today. Wondering about results. Knows Dr. Artist Pais is out today, but states that based on results of both, he may want pt to be on abx. Pt wondering if you could look into, see if another doc can look at it and decide. Pt would like a call back.

## 2012-06-07 MED ORDER — CIPROFLOXACIN HCL 500 MG PO TABS: 500.0000 mg | ORAL_TABLET | Freq: Two times a day (BID) | ORAL | Status: DC

## 2012-06-07 NOTE — Telephone Encounter (Signed)
rx sent in electronically, pt aware 

## 2012-06-07 NOTE — Telephone Encounter (Signed)
I suggest 7 day course of cipro 500 mg bid.  plz call into his pharm.  Pt should return in 2 weeks for repeat UA with microscopy.

## 2012-06-29 ENCOUNTER — Encounter: Payer: Self-pay | Admitting: Internal Medicine

## 2012-06-29 ENCOUNTER — Ambulatory Visit (INDEPENDENT_AMBULATORY_CARE_PROVIDER_SITE_OTHER): Payer: BC Managed Care – PPO | Admitting: Internal Medicine

## 2012-06-29 VITALS — BP 136/90 | HR 76 | Temp 98.0°F | Resp 16 | Ht 71.0 in | Wt 232.0 lb

## 2012-06-29 DIAGNOSIS — R1031 Right lower quadrant pain: Secondary | ICD-10-CM

## 2012-06-29 DIAGNOSIS — R6882 Decreased libido: Secondary | ICD-10-CM

## 2012-06-29 DIAGNOSIS — R3129 Other microscopic hematuria: Secondary | ICD-10-CM

## 2012-06-29 LAB — POCT URINALYSIS DIPSTICK
Bilirubin, UA: NEGATIVE
Leukocytes, UA: NEGATIVE
Nitrite, UA: NEGATIVE
Protein, UA: NEGATIVE
pH, UA: 6.5

## 2012-06-29 NOTE — Assessment & Plan Note (Signed)
Patient has persistent right groin pain of unclear etiology. Exam is unremarkable. No obvious hernia or testicular abnormality. He is experiencing intermittent BPH symptoms. Previous UA positive for microscopic hematuria. Refer to urology for further evaluation.  No improvement after taking cipro 500 mg twice daily for 7 days.

## 2012-06-29 NOTE — Assessment & Plan Note (Signed)
Testosterone level is low normal. Patient encouraged to continue working on weight loss.

## 2012-06-29 NOTE — Progress Notes (Signed)
Subjective:    Patient ID: Joseph Gibbs, male    DOB: [birth year], 46 y.o.   MRN: [id number]  HPI  46 year old white male for followup regarding right groin pain and decreased libido. At previous visit patient found to have microscopic hematuria of unclear etiology.  Renal ultrasound was negative for kidney stones or other renal lesion. Patient reports he has persistent intermittent right groin pain and testicular pain of unclear etiology. Symptoms not better or worse with activity. He has also noticed change in her urinary habits. He notes sense of incomplete voiding. He also complains of urinary frequency and nocturia.  He was treated with Cipro 500 mg twice daily for one week. There is no change in symptomatology after taking antibiotics.  Reviewed results of recent blood test. His testosterone levels were low normal. Free testosterone was normal.  Review of Systems Negative for fever or chills  Past Medical History  Diagnosis Date  . Elevated BP     w/o mention of hypertension  . Low back pain   . Allergy   . Hypertension     History   Social History  . Marital Status: Married    Spouse Name: N/A    Number of Children: 1  . Years of Education: N/A   Occupational History  . 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 Smoking status: Never Smoker   . Smokeless tobacco: Never Used  . Alcohol Use: 2.5 oz/week    5 drink(s) per week     Comment: one drink daily   . Drug Use: No  . Sexually Active: Not on file   Other Topics Concern  . Not on file   Social History Narrative   Caffeine drinks daily but not soda     Past Surgical History  Procedure Date  . Lumbar epidural injection     [facility]    Family History  Problem Relation Age of Onset  . Breast cancer Mother 70  . Cancer Father     ? history of throat cancer  . Diabetes Other   . Colon cancer Neg Hx     Allergies  Allergen Reactions  . Sulfonamide  Derivatives     REACTION: Flushing and sweating  . Griseofulvin Nausea Only    Current Outpatient Prescriptions on File Prior to Visit  Medication Sig Dispense Refill  . amLODipine (NORVASC) 2.5 MG tablet Take 1 tablet (2.5 mg total) by mouth daily.  30 tablet  1  . clotrimazole-betamethasone (LOTRISONE) cream Apply topically 2 (two) times daily.  30 g  1    BP 136/90  Pulse 76  Temp 98 F (36.7 C)  Resp 16  Ht 5\' 11"  (1.803 m)  Wt 232 lb (105.235 kg)  BMI 32.36 kg/m2       Objective:   Physical Exam  Constitutional: He is oriented to person, place, and time. He appears well-developed and well-nourished.  Cardiovascular: Normal rate, regular rhythm and normal heart sounds.   Pulmonary/Chest: Effort normal and breath sounds normal. He has no wheezes.  Abdominal: Soft. Bowel sounds are normal. He exhibits no mass.  Genitourinary: Penis normal.       Normal testicular exam, no obvious hernias.  Neurological: He is alert and oriented to person, place, and time. No cranial nerve deficit.  Skin: Skin is warm and dry.  Psychiatric: He has a normal mood and affect. His behavior is normal.  Assessment & Plan:

## 2012-07-24 ENCOUNTER — Other Ambulatory Visit: Payer: Self-pay | Admitting: Internal Medicine

## 2012-09-23 ENCOUNTER — Other Ambulatory Visit: Payer: Self-pay | Admitting: Internal Medicine

## 2012-12-08 ENCOUNTER — Encounter: Payer: Self-pay | Admitting: Family Medicine

## 2012-12-08 ENCOUNTER — Ambulatory Visit (INDEPENDENT_AMBULATORY_CARE_PROVIDER_SITE_OTHER): Payer: BC Managed Care – PPO | Admitting: Family Medicine

## 2012-12-08 VITALS — BP 144/92 | HR 82 | Temp 98.0°F | Resp 16 | Ht 71.0 in | Wt 226.4 lb

## 2012-12-08 DIAGNOSIS — L255 Unspecified contact dermatitis due to plants, except food: Secondary | ICD-10-CM

## 2012-12-08 DIAGNOSIS — R21 Rash and other nonspecific skin eruption: Secondary | ICD-10-CM

## 2012-12-08 DIAGNOSIS — L237 Allergic contact dermatitis due to plants, except food: Secondary | ICD-10-CM

## 2012-12-08 MED ORDER — HYDROXYZINE HCL 25 MG PO TABS: 25.0000 mg | ORAL_TABLET | Freq: Three times a day (TID) | ORAL | Status: DC | PRN

## 2012-12-08 MED ORDER — PREDNISONE 50 MG PO TABS: ORAL_TABLET | ORAL | Status: DC

## 2012-12-08 NOTE — Progress Notes (Signed)
Chief complaint: Rash  History of present illness: The patient is a 47 year old male who was doing yard work this weekend and started to develop a rash today. Patient states that it is in multiple areas including his hands, right arm, left leg, and face. Patient states that it is very itchy. Patient denies any shortness of breath, any trouble swallowing, any fevers or chills. Patient denies any recent illnesses.  Past Medical History  Diagnosis Date  . Elevated BP     w/o mention of hypertension  . Low back pain   . Allergy   . Hypertension    Past Surgical History  Procedure Laterality Date  . Lumbar epidural injection      [facility]   Family History  Problem Relation Age of Onset  . Breast cancer Mother 48  . Cancer Father     ? history of throat cancer  . Diabetes Other   . Colon cancer Neg Hx    Physical exam Blood pressure 144/92, pulse 82, temperature 98 F (36.7 C), temperature source Oral, resp. rate 16, height 5\' 11"  (1.803 m), weight 226 lb 6.4 oz (102.694 kg), SpO2 98.00%. General: No apparent distress alert and oriented x3 mood and affect normal Respiratory: Patient's speak in full sentences and does not appear short of breath Skin: Warm dry intact patient though has areas of erythema that are sporadic in nature and appears to be more of a contact dermatitis. There is no discharge from these areas. There is no signs of cellulitis. No signs of skin breakdown. Neuro: Cranial nerves II through XII are intact, neurovascularly intact in all extremities with 2+ DTRs and 2+ pulses.  Assessment: Poison ivy/ contact dermatitis.   Plan: Prednisone, hydroxyzine for itch, and hydrocortisone for topical. Handout given. F/u if not resolved after prednisone.

## 2012-12-08 NOTE — Patient Instructions (Signed)
Poison Ivy  Poison ivy is a inflammation of the skin (contact dermatitis) caused by touching the allergens on the leaves of the ivy plant following previous exposure to the plant. The rash usually appears 48 hours after exposure. The rash is usually bumps (papules) or blisters (vesicles) in a linear pattern. Depending on your own sensitivity, the rash may simply cause redness and itching, or it may also progress to blisters which may break open. These must be well cared for to prevent secondary bacterial (germ) infection, followed by scarring. Keep any open areas dry, clean, dressed, and covered with an antibacterial ointment if needed. The eyes may also get puffy. The puffiness is worst in the morning and gets better as the day progresses. This dermatitis usually heals without scarring, within 2 to 3 weeks without treatment.  HOME CARE INSTRUCTIONS   Thoroughly wash with soap and water as soon as you have been exposed to poison ivy. You have about one half hour to remove the plant resin before it will cause the rash. This washing will destroy the oil or antigen on the skin that is causing, or will cause, the rash. Be sure to wash under your fingernails as any plant resin there will continue to spread the rash. Do not rub skin vigorously when washing affected area. Poison ivy cannot spread if no oil from the plant remains on your body. A rash that has progressed to weeping sores will not spread the rash unless you have not washed thoroughly. It is also important to wash any clothes you have been wearing as these may carry active allergens. The rash will return if you wear the unwashed clothing, even several days later.  Avoidance of the plant in the future is the best measure. Poison ivy plant can be recognized by the number of leaves. Generally, poison ivy has three leaves with flowering branches on a single stem.  Diphenhydramine may be purchased over the counter and used as needed for itching. Do not drive with  this medication if it makes you drowsy.Ask your caregiver about medication for children.  SEEK MEDICAL CARE IF:   Open sores develop.   Redness spreads beyond area of rash.   You notice purulent (pus-like) discharge.   You have increased pain.   Other signs of infection develop (such as fever).  Document Released: 07/25/2000 Document Revised: 10/20/2011 Document Reviewed: 06/13/2009  [employer] Patient Information 2013 [employer].

## 2013-01-03 ENCOUNTER — Other Ambulatory Visit: Payer: Self-pay | Admitting: Internal Medicine

## 2013-03-07 ENCOUNTER — Encounter: Payer: Self-pay | Admitting: Family Medicine

## 2013-03-07 ENCOUNTER — Ambulatory Visit (INDEPENDENT_AMBULATORY_CARE_PROVIDER_SITE_OTHER): Payer: BC Managed Care – PPO | Admitting: Family Medicine

## 2013-03-07 VITALS — BP 138/80 | HR 77 | Temp 97.8°F | Wt 231.0 lb

## 2013-03-07 DIAGNOSIS — L259 Unspecified contact dermatitis, unspecified cause: Secondary | ICD-10-CM

## 2013-03-07 DIAGNOSIS — R238 Other skin changes: Secondary | ICD-10-CM

## 2013-03-07 DIAGNOSIS — L309 Dermatitis, unspecified: Secondary | ICD-10-CM

## 2013-03-07 DIAGNOSIS — L988 Other specified disorders of the skin and subcutaneous tissue: Secondary | ICD-10-CM

## 2013-03-07 MED ORDER — CLOTRIMAZOLE-BETAMETHASONE 1-0.05 % EX CREA: TOPICAL_CREAM | Freq: Two times a day (BID) | CUTANEOUS | Status: DC

## 2013-03-07 NOTE — Progress Notes (Signed)
  Subjective:    Patient ID: Joseph Gibbs, male    DOB: 03-Jun-1966, 47 y.o.   MRN: [id number]  HPI Acute visit One month ago possible bug bite right waist. Initially had some mild soreness but now asymptomatic. No itching. Redness and swelling or fading. Denies any other generalized rash  He has some recurrent eczema involving the scalp. Requesting refills of Lotrisone cream which seemed to help  Past Medical History  Diagnosis Date  . Elevated BP     w/o mention of hypertension  . Low back pain   . Allergy   . Hypertension    Past Surgical History  Procedure Laterality Date  . Lumbar epidural injection      [facility]    reports that he has never smoked. He has never used smokeless tobacco. He reports that he drinks about 2.5 ounces of alcohol per week. He reports that he does not use illicit drugs. family history includes Breast cancer (age of onset: 11) in his mother; Cancer in his father; and Diabetes in his other.  There is no history of Colon cancer. Allergies  Allergen Reactions  . Sulfonamide Derivatives     REACTION: Flushing and sweating  . Griseofulvin Nausea Only      Review of Systems  Constitutional: Negative for fever and chills.  Skin: Positive for rash.       Objective:   Physical Exam  Constitutional: He appears well-developed and well-nourished.  Cardiovascular: Normal rate and regular rhythm.   Pulmonary/Chest: Effort normal and breath sounds normal. No respiratory distress. He has no wheezes. He has no rales.  Skin:  Nonspecific fading erythematous papule right hip area. No postural. Nontender. Nonfluctuant.  He has some nonspecific scaling right occipital area scalp otherwise clear          Assessment & Plan:  #1 nonspecific resolving erythematous papule right hip. Reassurance #2 eczema involving scalp. Consider anti-seborrhea shampoos such as Selsun Blue. Refill Lotrisone cream for as needed use

## 2013-05-23 ENCOUNTER — Other Ambulatory Visit: Payer: Self-pay | Admitting: Internal Medicine

## 2013-07-06 ENCOUNTER — Other Ambulatory Visit: Payer: Self-pay | Admitting: Internal Medicine

## 2013-08-09 ENCOUNTER — Other Ambulatory Visit: Payer: Self-pay | Admitting: Internal Medicine

## 2013-08-21 ENCOUNTER — Other Ambulatory Visit: Payer: Self-pay | Admitting: Internal Medicine

## 2013-08-22 ENCOUNTER — Telehealth: Payer: Self-pay | Admitting: Internal Medicine

## 2013-08-22 MED ORDER — AMLODIPINE BESYLATE 2.5 MG PO TABS: ORAL_TABLET | ORAL | Status: DC

## 2013-08-22 NOTE — Telephone Encounter (Signed)
rx sent in electronically 

## 2013-08-22 NOTE — Telephone Encounter (Signed)
Pt request refill of amLODipine (NORVASC) 2.5 MG tablet Pt has made the needed apt for 1/28. Can you refill 30 days until then? [facility] [address]

## 2013-09-07 ENCOUNTER — Encounter: Payer: Self-pay | Admitting: Internal Medicine

## 2013-09-07 ENCOUNTER — Ambulatory Visit (INDEPENDENT_AMBULATORY_CARE_PROVIDER_SITE_OTHER): Payer: BC Managed Care – PPO | Admitting: Internal Medicine

## 2013-09-07 VITALS — BP 138/78 | HR 80 | Temp 98.3°F | Ht 71.0 in | Wt 236.0 lb

## 2013-09-07 DIAGNOSIS — G4733 Obstructive sleep apnea (adult) (pediatric): Secondary | ICD-10-CM | POA: Insufficient documentation

## 2013-09-07 DIAGNOSIS — I10 Essential (primary) hypertension: Secondary | ICD-10-CM

## 2013-09-07 DIAGNOSIS — R0683 Snoring: Secondary | ICD-10-CM

## 2013-09-07 DIAGNOSIS — R0989 Other specified symptoms and signs involving the circulatory and respiratory systems: Secondary | ICD-10-CM

## 2013-09-07 DIAGNOSIS — R0609 Other forms of dyspnea: Secondary | ICD-10-CM

## 2013-09-07 MED ORDER — CLOTRIMAZOLE-BETAMETHASONE 1-0.05 % EX CREA: TOPICAL_CREAM | Freq: Two times a day (BID) | CUTANEOUS | Status: DC

## 2013-09-07 MED ORDER — LOSARTAN POTASSIUM 50 MG PO TABS: 50.0000 mg | ORAL_TABLET | Freq: Every day | ORAL | Status: DC

## 2013-09-07 NOTE — Patient Instructions (Signed)
Please complete the following lab tests before your next follow up appointment: BMET, Joseph Gibbs.9

## 2013-09-07 NOTE — Progress Notes (Signed)
   Subjective:    Patient ID: Joseph Gibbs, male    DOB: 1966-02-21, 48 y.o.   MRN: [id number]  HPI  48 year old white male with history of hypertension, mild obesity and low back pain for followup. Patient complains over the last 12 months worsening symptoms of fatigue. His wife has noticed very loud snoring and observed episodes of apnea. Patient has decreased sleep latency.  Multiple family members have sleep apnea.  Epworth sleepiness scale is 12  Hypertension-patient reports good compliance with amlodipine 2.5 mg. He is not exercising on a regular basis.   Review of Systems Negative for chest pain, negative for shortness of breath    Past Medical History  Diagnosis Date  . Low back pain   . Allergy   . Hypertension     History   Social History  . Marital Status: Married    Spouse Name: N/A    Number of Children: 1  . Years of Education: N/A   Occupational History  . Owner IT consultant business    Social History Main Topics  . Smoking status: Never Smoker   . Smokeless tobacco: Never Used  . Alcohol Use: 2.5 oz/week    5 drink(s) per week     Comment: one drink daily   . Drug Use: No  . Sexual Activity: Not on file   Other Topics Concern  . Not on file   Social History Narrative   Caffeine drinks daily but not soda     Past Surgical History  Procedure Laterality Date  . Lumbar epidural injection      [facility]    Family History  Problem Relation Age of Onset  . Breast cancer Mother 71  . Cancer Father     ? history of throat cancer  . Diabetes Other   . Colon cancer Neg Hx     Allergies  Allergen Reactions  . Sulfonamide Derivatives     REACTION: Flushing and sweating  . Griseofulvin Nausea Only    Current Outpatient Prescriptions on File Prior to Visit  Medication Sig Dispense Refill  . amLODipine (NORVASC) 2.5 MG tablet TAKE 1 TABLET BY MOUTH EVERY DAY  30 tablet  0   No current facility-administered medications on  file prior to visit.    BP 138/78  Pulse 80  Temp(Src) 98.3 F (36.8 C) (Oral)  Ht 5\' 11"  (1.803 m)  Wt 236 lb (107.049 kg)  BMI 32.93 kg/m2      Objective:   Physical Exam  Constitutional: He is oriented to person, place, and time. He appears well-developed and well-nourished. No distress.  HENT:  Head: Normocephalic and atraumatic.  Slowly crowded oropharynx, thick neck  Neck: Neck supple.  Cardiovascular: Normal rate, regular rhythm and normal heart sounds.   No murmur heard. Pulmonary/Chest: Effort normal and breath sounds normal. He has no wheezes.  Musculoskeletal: He exhibits no edema.  Lymphadenopathy:    He has no cervical adenopathy.  Neurological: He is alert and oriented to person, place, and time. No cranial nerve deficit.  Skin: Skin is warm and dry.  Psychiatric: He has a normal mood and affect. His behavior is normal.          Assessment & Plan:

## 2013-09-07 NOTE — Assessment & Plan Note (Signed)
Patient's blood pressure is suboptimally controlled. Continue amlodipine 2.5 mg. Add losartan 50 mg. Electrolytes and kidney function before next office visit.

## 2013-09-07 NOTE — Progress Notes (Signed)
Pre visit review using our clinic review tool, if applicable. No additional management support is needed unless otherwise documented below in the visit note. 

## 2013-09-07 NOTE — Assessment & Plan Note (Signed)
Patient with history of loud snoring and high Epworth sleepiness scale. Arrange split night sleep study.  Follow up with Dr. Elsworth Soho

## 2013-09-09 ENCOUNTER — Telehealth: Payer: Self-pay | Admitting: Internal Medicine

## 2013-09-09 NOTE — Telephone Encounter (Signed)
Relevant patient education assigned to patient using Emmi. ° °

## 2013-09-09 NOTE — Addendum Note (Signed)
Addended by: Townsend Roger D on: 09/09/2013 04:09 PM   Modules accepted: Orders

## 2013-10-07 ENCOUNTER — Ambulatory Visit (HOSPITAL_BASED_OUTPATIENT_CLINIC_OR_DEPARTMENT_OTHER): Payer: BC Managed Care – PPO | Attending: Internal Medicine | Admitting: Radiology

## 2013-10-07 VITALS — Ht 72.0 in | Wt 215.0 lb

## 2013-10-07 DIAGNOSIS — G4733 Obstructive sleep apnea (adult) (pediatric): Secondary | ICD-10-CM | POA: Insufficient documentation

## 2013-10-07 DIAGNOSIS — R0683 Snoring: Secondary | ICD-10-CM

## 2013-10-11 ENCOUNTER — Encounter (HOSPITAL_BASED_OUTPATIENT_CLINIC_OR_DEPARTMENT_OTHER): Payer: BC Managed Care – PPO

## 2013-10-14 DIAGNOSIS — R0609 Other forms of dyspnea: Secondary | ICD-10-CM

## 2013-10-14 DIAGNOSIS — R0989 Other specified symptoms and signs involving the circulatory and respiratory systems: Secondary | ICD-10-CM

## 2013-10-14 NOTE — Sleep Study (Signed)
   NAME: Joseph Gibbs DATE OF BIRTH:  02-20-1966 MEDICAL RECORD NUMBER [id number]  LOCATION: [facility]  PHYSICIAN: Kathee Delton  DATE OF STUDY: 10/07/2013  SLEEP STUDY TYPE: Nocturnal Polysomnogram               REFERRING PHYSICIAN: Shawna Orleans, Doe-Hyun R, DO  INDICATION FOR STUDY:  Hypersomnia with sleep apnea  EPWORTH SLEEPINESS SCORE:  14 HEIGHT: 6' (182.9 cm)  WEIGHT: 215 lb (97.523 kg)    Body mass index is 29.15 kg/(m^2).  NECK SIZE: 16 in.  MEDICATIONS: Reviewed in the sleep record  SLEEP ARCHITECTURE: The patient had a total sleep time of 332 minutes with no slow-wave sleep and decreased quantity of REM. Sleep onset latency was normal at 5 minutes, and REM onset was normal as well. Sleep efficiency was 83% during the diagnostic portion of the study, and as well as 83% during the titration portion.  RESPIRATORY DATA: The patient underwent a split night study where he was found to have 39 primarily obstructive events in the first 137 minutes of sleep. This gave him an AHI during the diagnostic portion of the study of 17 events per hour. The events primarily occurred in the supine position, and there was moderate to loud snoring noted throughout. By protocol, he was fitted with a medium Fisher Paykel Simplus full face mask, and found to have an optimal CPAP pressure of 10 cm of water. 7 cm of pressure result his apnea, and he was increased to 10 cm in order to resolve snoring.  OXYGEN DATA: There was oxygen desaturation as low as 79% with the patient's obstructive events.  CARDIAC DATA: Rare PAC and PVC noted, but no clinically significant arrhythmias were seen  MOVEMENT/PARASOMNIA: There were no significant limb movements or abnormal behaviors noted.  IMPRESSION/ RECOMMENDATION:    1) split-night study reveals mild to moderate obstructive sleep apnea, with an AHI of 17 events per hour and oxygen desaturation as low as 79% during the diagnostic portion  of the study. The patient was then fitted with a medium Fisher Paykel full face mask, and found to have an optimal CPAP pressure of 10 cm of water. He should also be encouraged to work on modest weight loss. Because of the severity of his sleep apnea, other treatment alternatives could be considered, such as weight loss, upper airway surgery, and dental appliance. Clinical correlation is suggested.  2) rare PAC and PVC noted, but no clinically significant arrhythmias were seen.       Bureau, American Board of Sleep Medicine  ELECTRONICALLY SIGNED ON:  10/14/2013, 9:40 AM [place] PH: [phone]   FX: [phone] [place]

## 2013-10-15 ENCOUNTER — Other Ambulatory Visit: Payer: Self-pay | Admitting: Internal Medicine

## 2013-10-18 ENCOUNTER — Telehealth: Payer: Self-pay | Admitting: Internal Medicine

## 2013-10-18 NOTE — Telephone Encounter (Signed)
Pt would like results of sleep study test done a [place] [facility] over a wk ago

## 2013-10-19 NOTE — Telephone Encounter (Signed)
Can you contact Dr. Gwenette Greet to see if official interpretation is available.  Contact pt and let him know official reading is not available.

## 2013-10-20 ENCOUNTER — Other Ambulatory Visit: Payer: Self-pay | Admitting: Internal Medicine

## 2013-10-20 DIAGNOSIS — G4730 Sleep apnea, unspecified: Secondary | ICD-10-CM

## 2013-10-20 NOTE — Telephone Encounter (Signed)
Sent a staff message to Dr Gwenette Greet.  Pt aware we are waiting for results

## 2013-10-28 ENCOUNTER — Encounter: Payer: Self-pay | Admitting: Pulmonary Disease

## 2013-10-28 ENCOUNTER — Ambulatory Visit (INDEPENDENT_AMBULATORY_CARE_PROVIDER_SITE_OTHER): Payer: BC Managed Care – PPO | Admitting: Pulmonary Disease

## 2013-10-28 VITALS — BP 118/86 | HR 100 | Temp 98.5°F | Ht 71.0 in | Wt 240.8 lb

## 2013-10-28 DIAGNOSIS — R0683 Snoring: Secondary | ICD-10-CM

## 2013-10-28 DIAGNOSIS — G4733 Obstructive sleep apnea (adult) (pediatric): Secondary | ICD-10-CM

## 2013-10-28 DIAGNOSIS — R0609 Other forms of dyspnea: Secondary | ICD-10-CM

## 2013-10-28 DIAGNOSIS — R0989 Other specified symptoms and signs involving the circulatory and respiratory systems: Secondary | ICD-10-CM

## 2013-10-28 NOTE — Assessment & Plan Note (Signed)
The patient has only mild to moderate disease by his recent study, but he is severely symptomatic. This is greatly impacting he and his wife's quality of life, and he wishes to treat this aggressively. It is not a significant cardiovascular health risk. I have outlined possible treatment with a dental appliance or CPAP while he is working on weight loss, but I have recommended CPAP given the severe impact this has on his quality of life and the need to start treatment quickly.  The patient is agreeable to this approach.

## 2013-10-28 NOTE — Patient Instructions (Signed)
Will start on cpap at moderate pressure setting.  Please call if having tolerance issues Work on weight loss Followup with me in 8 weeks.

## 2013-10-28 NOTE — Progress Notes (Signed)
Subjective:    Patient ID: Joseph Gibbs, male    DOB: Nov 11, 1965, 48 y.o.   MRN: [id number]  HPI The patient is a 48 year old male who I've been asked to see for management of obstructive sleep apnea. He recently underwent a sleep study last month, which showed mild to moderate OSA with an AHI of 17 events per hour. He was started on CPAP, and titrated to an optimal pressure of 10 cm of water. The patient has been noted to have very loud snoring, as well as witnessed apneas. He has very frequent awakenings at night, and is not rested in the mornings upon arising. He notes severe inappropriate daytime sleepiness, to the point that it interferes with his work efficiency and alertness. He also falls asleep in the evening watching television or movies. He notes definite sleep pressure with driving, but does not fall asleep. His weight is up about 15 pounds over the last 2 years, and his Epworth score today is 11.   Sleep Questionnaire What time do you typically go to bed?( Between what hours) 9-10 9-10 at 1625 on 10/28/13 by Virl Cagey, CMA How long does it take you to fall asleep? instantly instantly at 1625 on 10/28/13 by Virl Cagey, CMA How many times during the night do you wake up? No Value 10-20 at 1625 on 10/28/13 by Virl Cagey, CMA What time do you get out of bed to start your day? 0600 0600 at 1625 on 10/28/13 by Virl Cagey, CMA Do you drive or operate heavy machinery in your occupation? No No at 1625 on 10/28/13 by Virl Cagey, CMA How much has your weight changed (up or down) over the past two years? (In pounds) 15 lb (6.804 kg) 15 lb (6.804 kg) at 1625 on 10/28/13 by Virl Cagey, CMA Have you ever had a sleep study before? Yes Yes at 1625 on 10/28/13 by Virl Cagey, CMA If yes, location of study? [facility] at 1625 on 10/28/13 by Virl Cagey, CMA If yes, date of study? 09/2013 09/2013 at 1625 on 10/28/13 by Virl Cagey, CMA Do you  currently use CPAP? No No at 1625 on 10/28/13 by Virl Cagey, CMA Do you wear oxygen at any time? No No at 1625 on 10/28/13 by Virl Cagey, CMA   Review of Systems  Constitutional: Negative for fever and unexpected weight change.  HENT: Negative for congestion, dental problem, ear pain, nosebleeds, postnasal drip, rhinorrhea, sinus pressure, sneezing, sore throat and trouble swallowing.   Eyes: Negative for redness and itching.  Respiratory: Negative for cough, chest tightness, shortness of breath and wheezing.   Cardiovascular: Negative for palpitations and leg swelling.  Gastrointestinal: Negative for nausea and vomiting.  Genitourinary: Negative for dysuria.  Musculoskeletal: Negative for joint swelling.  Skin: Negative for rash.  Neurological: Negative for headaches.  Hematological: Does not bruise/bleed easily.  Psychiatric/Behavioral: Negative for dysphoric mood. The patient is not nervous/anxious.        Objective:   Physical Exam Constitutional:  Obese male, no acute distress  HENT:  Nares patent without discharge  Oropharynx without exudate, palate and uvula are moderately elongated.  Eyes:  Perrla, eomi, no scleral icterus  Neck:  No JVD, no TMG  Cardiovascular:  Normal rate, regular rhythm, no rubs or gallops.  No murmurs        Intact distal pulses  Pulmonary :  Normal breath sounds, no stridor or respiratory distress  No rales, rhonchi, or wheezing  Abdominal:  Soft, nondistended, bowel sounds present.  No tenderness noted.   Musculoskeletal:  No lower extremity edema noted.  Lymph Nodes:  No cervical lymphadenopathy noted  Skin:  No cyanosis noted  Neurologic:  Appears sleepy but appropriate, moves all 4 extremities without obvious deficit.         Assessment & Plan:

## 2013-10-31 ENCOUNTER — Telehealth: Payer: Self-pay | Admitting: Pulmonary Disease

## 2013-10-31 NOTE — Telephone Encounter (Signed)
Order was faxed to [facility] 10/31/13. Called and made pt aware. Nothing further needed

## 2013-11-01 ENCOUNTER — Telehealth: Payer: Self-pay | Admitting: Pulmonary Disease

## 2013-11-01 NOTE — Telephone Encounter (Signed)
Called # for [facility] and is closed now. wcb in AM to get a good fax #

## 2013-11-02 NOTE — Telephone Encounter (Signed)
refaxed to [phone] [facility] [place]

## 2013-11-02 NOTE — Telephone Encounter (Signed)
I spoke with Melissa from [facility] and they have not received the order yet. I advised I will send a message to [facility] to have her re-fax the order with all needed information to [facility]. Kearny Bing, CMA

## 2013-11-03 ENCOUNTER — Telehealth: Payer: Self-pay | Admitting: Pulmonary Disease

## 2013-11-03 NOTE — Telephone Encounter (Signed)
Pt made aware of information below. Pt is going to [employer] and/or PCP office to figure out what is going with his insurance coverage. Pt will contact our office if anything further needed.

## 2013-11-03 NOTE — Telephone Encounter (Signed)
Spoke with patient Received bill in mail from [employer] regarding Consult appt/Sleep [employer] has denied claim for Consult appt/sleep study--pt received bill for $400+ [employer] states that the visit/study was *not medically necessary* -- Pt states that he spoke with billing at [facility] and they told him the visit with [facility] was not covered Pt is concerned that he is going to get a bigger bill for the actual sleep study. -------- Joseph Gibbs and she states that she viewed pts account. Sleep study ordered by previous office/PCP Precert was not needed -- [employer] --no precert required. (recorded in pt chart billing section) No denial/payment for the OV with [facility] at this time Unsure if sleep study has been denied or paid. The bill received by pt is for the cost of reading the sleep study.

## 2013-11-04 ENCOUNTER — Telehealth: Payer: Self-pay | Admitting: Pulmonary Disease

## 2013-11-04 NOTE — Telephone Encounter (Signed)
LMTCB

## 2013-11-07 NOTE — Telephone Encounter (Signed)
Pt returning call.Joseph Gibbs ° °

## 2013-11-07 NOTE — Telephone Encounter (Signed)
I spoke with the pt and he states that he spoke with [facility] and they are stating that the interpretation for the sleep study by Dr. Gwenette Greet was denied due to the diagnosis code that was provided which comes up as SOB. Diagnosis code needs to be changed and claim re-submitted.  I advised I will speak with chan.   I spoke with Vallarie Mare and we looked in pt chart and found the indication for the pt sleep study was hypersomnia with sleep apnea so I advised her to use this diagnosis. She has sent a message to [employer] asking them to re-submit the claim with this corrected code. The pt is aware. Genoa Bing, CMA

## 2013-11-07 NOTE — Telephone Encounter (Signed)
LMTCBx2. Jennifer Castillo, CMA  

## 2013-11-11 ENCOUNTER — Institutional Professional Consult (permissible substitution): Payer: BC Managed Care – PPO | Admitting: Pulmonary Disease

## 2014-02-10 ENCOUNTER — Other Ambulatory Visit: Payer: Self-pay | Admitting: Internal Medicine

## 2014-06-05 ENCOUNTER — Telehealth: Payer: Self-pay | Admitting: Pulmonary Disease

## 2014-06-05 NOTE — Telephone Encounter (Signed)
lmtcb X1 

## 2014-06-06 ENCOUNTER — Telehealth: Payer: Self-pay | Admitting: Internal Medicine

## 2014-06-06 NOTE — Telephone Encounter (Signed)
error 

## 2014-06-06 NOTE — Telephone Encounter (Signed)
Spoke with patient-he states he has received a bill for additional charges for his sleep study done on 10-07-13 by Dr Herminio Commons physician). Pt was told by [place] that the sleep study was coded wrong. Pt is aware that Dr Lora Havens office will need to take care of this as they ordered the sleep study and gave dx code. Pt calling Yoo's office now.

## 2014-10-23 ENCOUNTER — Other Ambulatory Visit: Payer: Self-pay | Admitting: Internal Medicine

## 2014-10-26 ENCOUNTER — Other Ambulatory Visit: Payer: Self-pay | Admitting: Internal Medicine

## 2014-11-17 ENCOUNTER — Other Ambulatory Visit: Payer: Self-pay | Admitting: Internal Medicine

## 2015-03-18 ENCOUNTER — Ambulatory Visit (INDEPENDENT_AMBULATORY_CARE_PROVIDER_SITE_OTHER): Payer: Commercial Managed Care - PPO | Admitting: Emergency Medicine

## 2015-03-18 VITALS — BP 120/82 | HR 105 | Temp 98.1°F | Resp 18 | Ht 71.0 in | Wt 229.5 lb

## 2015-03-18 DIAGNOSIS — K602 Anal fissure, unspecified: Secondary | ICD-10-CM | POA: Diagnosis not present

## 2015-03-18 MED ORDER — NITROGLYCERIN 0.4 % RE OINT: 1.0000 "application " | TOPICAL_OINTMENT | Freq: Two times a day (BID) | RECTAL | Status: DC

## 2015-03-18 MED ORDER — POLYETHYLENE GLYCOL 3350 17 GM/SCOOP PO POWD: 17.0000 g | Freq: Every day | ORAL | Status: DC

## 2015-03-18 NOTE — Patient Instructions (Signed)
Anal Fissure, Adult An anal fissure is a small tear or crack in the skin around the anus. Bleeding from a fissure usually stops on its own within a few minutes. However, bleeding will often reoccur with each bowel movement until the crack heals.  CAUSES   Passing large, hard stools.  Frequent diarrheal stools.  Constipation.  Inflammatory bowel disease (Crohn's disease or ulcerative colitis).  Infections.  Anal sex. SYMPTOMS   Small amounts of blood seen on your stools, on toilet paper, or in the toilet after a bowel movement.  Rectal bleeding.  Painful bowel movements.  Itching or irritation around the anus. DIAGNOSIS Your caregiver will examine the anal area. An anal fissure can usually be seen with careful inspection. A rectal exam may be performed and a short tube (anoscope) may be used to examine the anal canal. TREATMENT   You may be instructed to take fiber supplements. These supplements can soften your stool to help make bowel movements easier.  Sitz baths may be recommended to help heal the tear. Do not use soap in the sitz baths.  A medicated cream or ointment may be prescribed to lessen discomfort. HOME CARE INSTRUCTIONS   Maintain a diet high in fruits, whole grains, and vegetables. Avoid constipating foods like bananas and dairy products.  Take sitz baths as directed by your caregiver.  Drink enough fluids to keep your urine clear or pale yellow.  Only take over-the-counter or prescription medicines for pain, discomfort, or fever as directed by your caregiver. Do not take aspirin as this may increase bleeding.  Do not use ointments containing numbing medications (anesthetics) or hydrocortisone. They could slow healing. SEEK MEDICAL CARE IF:   Your fissure is not completely healed within 3 days.  You have further bleeding.  You have a fever.  You have diarrhea mixed with blood.  You have pain.  Your problem is getting worse rather than  better. MAKE SURE YOU:   Understand these instructions.  Will watch your condition.  Will get help right away if you are not doing well or get worse. Document Released: 07/28/2005 Document Revised: 10/20/2011 Document Reviewed: 01/12/2011 [facility] Patient Information 2015 [place], [place]. This information is not intended to replace advice given to you by your health care provider. Make sure you discuss any questions you have with your health care provider.

## 2015-03-18 NOTE — Progress Notes (Signed)
Subjective:  Patient ID: Joseph Gibbs, male    DOB: 1966-08-05  Age: 49 y.o. MRN: [id number]  CC: Rectal Pain   HPI Joseph Gibbs presents  with anal pain and burning. He said he has had no blood in stools or change in stool caliber. He has had an anal fissure in the past and now is using Tucks pads for itching and burning. He said that they had become more irritating than soothing. He denies any anal masses or blood or mucus or pus.  History Joseph Gibbs has a past medical history of Low back pain; Allergy; and Hypertension.   He has past surgical history that includes Lumbar epidural injection.   His  family history includes Breast cancer (age of onset: 53) in his mother; Cancer in his father; Diabetes in his other. There is no history of Colon cancer.  He   reports that he has never smoked. He has never used smokeless tobacco. He reports that he drinks about 2.5 oz of alcohol per week. He reports that he does not use illicit drugs.  Outpatient Prescriptions Prior to Visit  Medication Sig Dispense Refill  . clotrimazole-betamethasone (LOTRISONE) cream APPLY TOPICALLY 2 (TWO) TIMES DAILY. 30 g 1  . losartan (COZAAR) 50 MG tablet TAKE 1 TABLET EVERY DAY 90 tablet 0  . amLODipine (NORVASC) 2.5 MG tablet TAKE 1 TABLET BY MOUTH EVERY DAY **NEEDS OV** (Patient not taking: Reported on 03/18/2015) 30 tablet 5   No facility-administered medications prior to visit.    History   Social History  . Marital Status: Married    Spouse Name: N/A  . Number of Children: 1  . Years of Education: N/A   Occupational History  . Owner IT consultant business    Social History Main Topics  . Smoking status: Never Smoker   . Smokeless tobacco: Never Used  . Alcohol Use: 2.5 oz/week    5 drink(s) per week     Comment: few drinks a week  . Drug Use: No  . Sexual Activity: Not on file   Other Topics Concern  . None   Social History Narrative   Caffeine drinks daily but not  soda      Review of Systems  Constitutional: Negative for fever, chills and appetite change.  HENT: Negative for congestion, ear pain, postnasal drip, sinus pressure and sore throat.   Eyes: Negative for pain and redness.  Respiratory: Negative for cough, shortness of breath and wheezing.   Cardiovascular: Negative for leg swelling.  Gastrointestinal: Negative for nausea, vomiting, abdominal pain, diarrhea, constipation and blood in stool.  Endocrine: Negative for polyuria.  Genitourinary: Negative for dysuria, urgency, frequency and flank pain.  Musculoskeletal: Negative for gait problem.  Skin: Negative for rash.  Neurological: Negative for weakness and headaches.  Psychiatric/Behavioral: Negative for confusion and decreased concentration. The patient is not nervous/anxious.     Objective:  BP 120/82 mmHg  Pulse 105  Temp(Src) 98.1 F (36.7 C) (Oral)  Resp 18  Ht 5\' 11"  (1.803 m)  Wt 229 lb 8 oz (104.101 kg)  BMI 32.02 kg/m2  SpO2 99%  Physical Exam  Constitutional: He is oriented to person, place, and time. He appears well-developed and well-nourished. No distress.  HENT:  Head: Normocephalic and atraumatic.  Right Ear: External ear normal.  Left Ear: External ear normal.  Nose: Nose normal.  Eyes: Conjunctivae and EOM are normal. Pupils are equal, round, and reactive to light. No scleral icterus.  Neck: Normal range  of motion. Neck supple. No tracheal deviation present.  Cardiovascular: Normal rate, regular rhythm and normal heart sounds.   Pulmonary/Chest: Effort normal. No respiratory distress. He has no wheezes. He has no rales.  Abdominal: He exhibits no mass. There is no tenderness. There is no rebound and no guarding.  Musculoskeletal: He exhibits no edema.  Lymphadenopathy:    He has no cervical adenopathy.  Neurological: He is alert and oriented to person, place, and time. Coordination normal.  Skin: Skin is warm and dry. No rash noted.  Psychiatric: He has  a normal mood and affect. His behavior is normal.   Digital rectal exam: He was noted to have several fissures on the anoderm. There is no palpable mass in the rectal vault. There is no stool or blood. There are no hemorrhoids.   Assessment & Plan:   Joseph Gibbs was seen today for rectal pain.  Diagnoses and all orders for this visit:  Anal fissure  Other orders -     Nitroglycerin 0.4 % OINT; Place 1 application rectally 2 (two) times daily. -     polyethylene glycol powder (GLYCOLAX/MIRALAX) powder; Take 17 g by mouth daily.   I am having Joseph Gibbs start on Nitroglycerin and polyethylene glycol powder. I am also having him maintain his amLODipine, clotrimazole-betamethasone, and losartan.  Meds ordered this encounter  Medications  . Nitroglycerin 0.4 % OINT    Sig: Place 1 application rectally 2 (two) times daily.    Dispense:  30 g    Refill:  1  . polyethylene glycol powder (GLYCOLAX/MIRALAX) powder    Sig: Take 17 g by mouth daily.    Dispense:  3350 g    Refill:  1    Appropriate red flag conditions were discussed with the patient as well as actions that should be taken.  Patient expressed his understanding.  Follow-up: Return in about 2 months (around 05/18/2015).  Roselee Culver, MD

## 2015-04-05 ENCOUNTER — Other Ambulatory Visit: Payer: Self-pay | Admitting: Internal Medicine

## 2015-05-09 ENCOUNTER — Other Ambulatory Visit: Payer: Self-pay | Admitting: Internal Medicine

## 2015-05-10 ENCOUNTER — Ambulatory Visit (INDEPENDENT_AMBULATORY_CARE_PROVIDER_SITE_OTHER): Payer: Commercial Managed Care - PPO | Admitting: Family Medicine

## 2015-05-10 VITALS — BP 126/78 | HR 68 | Temp 98.0°F | Resp 18 | Ht 71.0 in | Wt 226.4 lb

## 2015-05-10 DIAGNOSIS — B354 Tinea corporis: Secondary | ICD-10-CM | POA: Diagnosis not present

## 2015-05-10 MED ORDER — FLUCONAZOLE 150 MG PO TABS: 150.0000 mg | ORAL_TABLET | Freq: Every day | ORAL | Status: DC

## 2015-05-10 MED ORDER — KETOCONAZOLE 2 % EX CREA: 1.0000 "application " | TOPICAL_CREAM | Freq: Every day | CUTANEOUS | Status: DC

## 2015-05-10 NOTE — Progress Notes (Signed)
   Subjective:    Patient ID: Joseph Gibbs, male    DOB: [birth year], 49 y.o.   MRN: [id number] This chart was scribed for Robyn Haber, MD by Zola Button, Medical Scribe. This patient was seen in Room 10 and the patient's care was started at 9:51 AM.   HPI HPI Comments: Joseph Gibbs is a 49 y.o. male who presents to the [facility] complaining of gradual onset, pruritic rash to his groin area and his bilateral axilla areas that started a few days ago. Patient feels like the rash may be spreading as he feels some itchiness to his scalp and face. He has been trying to keep the areas dry. Patient denies contact with poison ivy. However, he has been exercising in a gym.  Patient works in an [employer].  Review of Systems  Skin: Positive for rash.       Objective:   Physical Exam CONSTITUTIONAL: Well developed/well nourished HEAD: Normocephalic/atraumatic EYES: EOM/PERRL ENMT: Mucous membranes moist NECK: supple no meningeal signs SPINE: entire spine nontender CV: S1/S2 noted, no murmurs/rubs/gallops noted LUNGS: Lungs are clear to auscultation bilaterally, no apparent distress ABDOMEN: soft, nontender, no rebound or guarding GU: no cva tenderness NEURO: Pt is awake/alert, moves all extremitiesx4 EXTREMITIES: pulses normal, full ROM SKIN: warm, color normal; multiple 1-2 mm crusting papules in the axillae, groin areas. He has a 2 cm crusting confluent rash in the skin fold of his lower abdomen. PSYCH: no abnormalities of mood noted        Assessment & Plan:   By signing my name below, I, Zola Button, attest that this documentation has been prepared under the direction and in the presence of Robyn Haber, MD.  Electronically Signed: Zola Button, Medical Scribe. 05/10/2015. 9:51 AM. This chart was scribed in my presence and reviewed by me personally.    ICD-9-CM ICD-10-CM   1. Tinea corporis 110.5 B35.4 fluconazole (DIFLUCAN) 150  MG tablet     ketoconazole (NIZORAL) 2 % cream     Signed, Robyn Haber, MD

## 2015-05-10 NOTE — Patient Instructions (Signed)
Body Ringworm Ringworm (tinea corporis) is a fungal infection of the skin on the body. This infection is not caused by worms, but is actually caused by a fungus. Fungus normally lives on the top of your skin and can be useful. However, in the case of ringworms, the fungus grows out of control and causes a skin infection. It can involve any area of skin on the body and can spread easily from one person to another (contagious). Ringworm is a common problem for children, but it can affect adults as well. Ringworm is also often found in athletes, especially wrestlers who share equipment and mats.  CAUSES  Ringworm of the body is caused by a fungus called dermatophyte. It can spread by:  Touchingother people who are infected.  Touchinginfected pets.  Touching or sharingobjects that have been in contact with the infected person or pet (hats, combs, towels, clothing, sports equipment). SYMPTOMS   Itchy, raised red spots and bumps on the skin.  Ring-shaped rash.  Redness near the border of the rash with a clear center.  Dry and scaly skin on or around the rash. Not every person develops a ring-shaped rash. Some develop only the red, scaly patches. DIAGNOSIS  Most often, ringworm can be diagnosed by performing a skin exam. Your caregiver may choose to take a skin scraping from the affected area. The sample will be examined under the microscope to see if the fungus is present.  TREATMENT  Body ringworm may be treated with a topical antifungal cream or ointment. Sometimes, an antifungal shampoo that can be used on your body is prescribed. You may be prescribed antifungal medicines to take by mouth if your ringworm is severe, keeps coming back, or lasts a long time.  HOME CARE INSTRUCTIONS   Only take over-the-counter or prescription medicines as directed by your caregiver.  Wash the infected area and dry it completely before applying yourcream or ointment.  When using antifungal shampoo to  treat the ringworm, leave the shampoo on the body for 3-5 minutes before rinsing.   Wear loose clothing to stop clothes from rubbing and irritating the rash.  Wash or change your bed sheets every night while you have the rash.  Have your pet treated by your veterinarian if it has the same infection. To prevent ringworm:   Practice good hygiene.  Wear sandals or shoes in public places and showers.  Do not share personal items with others.  Avoid touching red patches of skin on other people.  Avoid touching pets that have bald spots or wash your hands after doing so. SEEK MEDICAL CARE IF:   Your rash continues to spread after 7 days of treatment.  Your rash is not gone in 4 weeks.  The area around your rash becomes red, warm, tender, and swollen. Document Released: 07/25/2000 Document Revised: 04/21/2012 Document Reviewed: 02/09/2012 [facility] Patient Information 2015 [place], [place]. This information is not intended to replace advice given to you by your health care provider. Make sure you discuss any questions you have with your health care provider. Jock Itch Jock itch is a fungal infection of the skin in the groin area. It is sometimes called "ringworm" even though it is not caused by a worm. A fungus is a type of germ that thrives in dark, damp places.  CAUSES  This infection may spread from:  A fungus infection elsewhere on the body (such as athlete's foot).  Sharing towels or clothing. This infection is more common in:  Hot, humid  climates.  People who wear tight-fitting clothing or wet bathing suits for long periods of time.  Athletes.  Overweight people.  People with diabetes. SYMPTOMS  Jock itch causes the following symptoms:  Red, pink or brown rash in the groin. Rash may spread to the thighs, anus, and buttocks.  Itching. DIAGNOSIS  Your caregiver may make the diagnosis by looking at the rash. Sometimes a skin scraping will be sent to test for fungus.  Testing can be done either by looking under the microscope or by doing a culture (test to try to grow the fungus). A culture can take up to 2 weeks to come back. TREATMENT  Jock itch may be treated with:  Skin cream or ointment to kill fungus.  Medicine by mouth to kill fungus.  Skin cream or ointment to calm the itching.  Compresses or medicated powders to dry the infected skin. HOME CARE INSTRUCTIONS   Be sure to treat the rash completely. Follow your caregiver's instructions. It can take a couple of weeks to treat. If you do not treat the infection long enough, the rash can come back.  Wear loose-fitting clothing.  Men should wear cotton boxer shorts.  Women should wear cotton underwear.  Avoid hot baths.  Dry the groin area well after bathing. SEEK MEDICAL CARE IF:   Your rash is worse.  Your rash is spreading.  Your rash returns after treatment is finished.  Your rash is not gone in 4 weeks. Fungal infections are slow to respond to treatment. Some redness may remain for several weeks after the fungus is gone. SEEK IMMEDIATE MEDICAL CARE IF:  The area becomes red, warm, tender, and swollen.  You have a fever. Document Released: 07/18/2002 Document Revised: 10/20/2011 Document Reviewed: 06/16/2008 [facility] Patient Information 2015 [place], [place]. This information is not intended to replace advice given to you by your health care provider. Make sure you discuss any questions you have with your health care provider.

## 2015-05-12 ENCOUNTER — Telehealth: Payer: Self-pay | Admitting: Family Medicine

## 2015-05-12 DIAGNOSIS — B354 Tinea corporis: Secondary | ICD-10-CM

## 2015-05-12 MED ORDER — FLUCONAZOLE 150 MG PO TABS: 150.0000 mg | ORAL_TABLET | Freq: Once | ORAL | Status: DC

## 2015-05-12 NOTE — Telephone Encounter (Signed)
Answering service call 5:30 pm Sat 10/1 - pt states he called at 8 a.m. but never heard back from anyone - there is no call logged in the system which is concerning.    Rash is no better - Dr. Carlean Jews told him to call if this was the case.

## 2015-05-13 ENCOUNTER — Other Ambulatory Visit: Payer: Self-pay | Admitting: Family Medicine

## 2015-05-13 DIAGNOSIS — R21 Rash and other nonspecific skin eruption: Secondary | ICD-10-CM

## 2015-05-13 NOTE — Telephone Encounter (Signed)
I really expected at least some improvement by now.  I will make a referral.  If patient cannot get a timely referral, please return and we will perform a small skin biopsy.

## 2015-05-13 NOTE — Telephone Encounter (Signed)
See message/ patient states rash no better

## 2015-05-14 NOTE — Telephone Encounter (Signed)
Left message for pt to call back  °

## 2015-05-15 NOTE — Telephone Encounter (Signed)
Left message for pt to call back  °

## 2015-06-06 ENCOUNTER — Ambulatory Visit (INDEPENDENT_AMBULATORY_CARE_PROVIDER_SITE_OTHER): Payer: Commercial Managed Care - PPO | Admitting: Family Medicine

## 2015-06-06 VITALS — BP 146/86 | HR 82 | Temp 97.7°F | Resp 18 | Ht 71.25 in | Wt 224.6 lb

## 2015-06-06 DIAGNOSIS — J029 Acute pharyngitis, unspecified: Secondary | ICD-10-CM | POA: Diagnosis not present

## 2015-06-06 DIAGNOSIS — R21 Rash and other nonspecific skin eruption: Secondary | ICD-10-CM

## 2015-06-06 LAB — POCT CBC
GRANULOCYTE PERCENT: 66.4 % (ref 37–80)
HEMATOCRIT: 47.5 % (ref 43.5–53.7)
Hemoglobin: 16.3 g/dL (ref 14.1–18.1)
LYMPH, POC: 2.4 (ref 0.6–3.4)
MCH, POC: 30.1 pg (ref 27–31.2)
MCHC: 34.3 g/dL (ref 31.8–35.4)
MCV: 87.8 fL (ref 80–97)
MID (cbc): 0.4 (ref 0–0.9)
MPV: 5.9 fL (ref 0–99.8)
POC GRANULOCYTE: 5.5 (ref 2–6.9)
POC LYMPH PERCENT: 28.5 %L (ref 10–50)
POC MID %: 5.1 % (ref 0–12)
Platelet Count, POC: 237 10*3/uL (ref 142–424)
RBC: 5.4 M/uL (ref 4.69–6.13)
RDW, POC: 12.8 %
WBC: 8.3 10*3/uL (ref 4.6–10.2)

## 2015-06-06 MED ORDER — PREDNISONE 20 MG PO TABS: ORAL_TABLET | ORAL | Status: DC

## 2015-06-06 NOTE — Patient Instructions (Signed)

## 2015-06-06 NOTE — Progress Notes (Signed)
Subjective:    Patient ID: Joseph Gibbs, male    DOB: Jun 02, 1966, 49 y.o.   MRN: [id number]  This chart was scribed for Joseph Forts, MD by Zola Button, Medical Scribe. This patient was seen in Room 9 and the patient's care was started at 8:15 PM.   06/06/2015  Follow-up   HPI  HPI Comments: [name] is a 49 y.o. male who presents to the [facility] for a follow-up for rash. Patient was seen 1 month ago by Dr. Joseph Art for rash to his groin and axilla areas. Today, he notes that the rash has worsened. The rash has spread to his back, abdomen, chest and scalp, and it spread to his elbows this past week. The itchiness is worse in the back. He also reports having general malaise. He has also had a mild sore throat and congestion. Patient is almost finished with the fluconazole which has not been effective. He also notes the ketoconazole has not been helpful. He also began taking Lotrisone. Patient has tried Benadryl which helped him get to sleep. He also tried Claritin, which he states was not as effective as the Benadryl. Patient denies fever, chills, diaphoresis, and headaches. He notes that he has not worked out in a gym since the rash started.   Review of Systems  Constitutional: Positive for fatigue. Negative for fever, chills and diaphoresis.  HENT: Positive for congestion and sore throat. Negative for ear pain, postnasal drip, trouble swallowing and voice change.   Respiratory: Negative for cough.   Skin: Positive for rash.  Neurological: Negative for headaches.    Past Medical History  Diagnosis Date  . Low back pain   . Allergy   . Hypertension   . Tubular adenoma of colon 03/2011   Past Surgical History  Procedure Laterality Date  . Lumbar epidural injection      [facility]   Allergies  Allergen Reactions  . Sulfonamide Derivatives     REACTION: Flushing and sweating  . Griseofulvin Nausea Only    Social History    Social History  . Marital Status: Married    Spouse Name: N/A  . Number of Children: 1  . Years of Education: N/A   Occupational History  . Owner IT consultant business    Social History Main Topics  . Smoking status: Never Smoker   . Smokeless tobacco: Never Used  . Alcohol Use: 3.0 oz/week    5 Standard drinks or equivalent per week     Comment: few drinks a week  . Drug Use: No  . Sexual Activity: Not on file   Other Topics Concern  . Not on file   Social History Narrative   Caffeine drinks daily but not soda    Family History  Problem Relation Age of Onset  . Breast cancer Mother 40  . Cancer Father     ? history of throat cancer  . Diabetes Other   . Colon cancer Neg Hx        Objective:    BP 146/86 mmHg  Pulse 82  Temp(Src) 97.7 F (36.5 C) (Oral)  Resp 18  Ht 5' 11.25" (1.81 m)  Wt 224 lb 9.6 oz (101.878 kg)  BMI 31.10 kg/m2  SpO2 99% Physical Exam  Constitutional: He is oriented to person, place, and time. He appears well-developed and well-nourished. No distress.  HENT:  Head: Normocephalic and atraumatic.  Right Ear: External ear normal.  Left Ear: External  ear normal.  Nose: Nose normal.  Mouth/Throat: Oropharynx is clear and moist. No oropharyngeal exudate.  Eyes: Conjunctivae and EOM are normal. Pupils are equal, round, and reactive to light.  Neck: Normal range of motion. Neck supple. Carotid bruit is not present. No thyromegaly present.  Cardiovascular: Normal rate, regular rhythm, normal heart sounds and intact distal pulses.  Exam reveals no gallop and no friction rub.   No murmur heard. Pulmonary/Chest: Effort normal and breath sounds normal. He has no wheezes. He has no rales.  Abdominal: Soft. Bowel sounds are normal. He exhibits no distension and no mass. There is no tenderness. There is no rebound and no guarding.  Musculoskeletal: He exhibits no edema.  Lymphadenopathy:    He has no cervical adenopathy.  Neurological: He is  alert and oriented to person, place, and time. No cranial nerve deficit.  Skin: Skin is warm and dry. Rash noted. He is not diaphoretic. No erythema.  Scaling rash present on extensor surfaces of elbows bilaterally. Maculopapular rash to posterior scalp, axillas, anterior chest, and upper back without erythema or excoriations.  Psychiatric: He has a normal mood and affect. His behavior is normal.  Nursing note and vitals reviewed.       Assessment & Plan:   1. Rash and nonspecific skin eruption   2. Sore throat    -Worsening rash. -No improvement with Diflucan and Ketoconazole. -Rx for Prednisone provided due to diffuse nature of rash. -Continue Claritin and Benadryl. -Refer to dermatology; if no improvement with prednisone,will warrant bx. -Due to one month duration, obtain labs to rule out secondary causes.   Orders Placed This Encounter  Procedures  . Culture, Group A Strep  . Comprehensive metabolic panel  . TSH  . Sedimentation Rate  . Ambulatory referral to Dermatology    Referral Priority:  Routine    Referral Type:  Consultation    Referral Reason:  Specialty Services Required    Requested Specialty:  Dermatology    Number of Visits Requested:  1  . POCT CBC   Meds ordered this encounter  Medications  . DISCONTD: predniSONE (DELTASONE) 20 MG tablet    Sig: Three tablets daily x 2 days then two tablets daily x 5 days then one tablet daily x 5 days    Dispense:  21 tablet    Refill:  0    Return if symptoms worsen or fail to improve.    By signing my name below, I, Zola Button, attest that this documentation has been prepared under the direction and in the presence of Joseph Forts, MD.  Electronically Signed: Zola Button, Medical Scribe. 07/08/2015. 5:43 PM.   I personally performed the services described in this documentation, which was scribed in my presence. The recorded information has been reviewed and considered.  Tyresha Fede Elayne Guerin, M.D. [facility] [address] [place], [place]  [address] [phone] phone [phone] fax

## 2015-06-07 LAB — COMPREHENSIVE METABOLIC PANEL
ALBUMIN: 4.3 g/dL (ref 3.6–5.1)
ALT: 21 U/L (ref 9–46)
AST: 17 U/L (ref 10–40)
Alkaline Phosphatase: 66 U/L (ref 40–115)
BILIRUBIN TOTAL: 0.7 mg/dL (ref 0.2–1.2)
BUN: 13 mg/dL (ref 7–25)
CO2: 30 mmol/L (ref 20–31)
CREATININE: 1.16 mg/dL (ref 0.60–1.35)
Calcium: 9.5 mg/dL (ref 8.6–10.3)
Chloride: 99 mmol/L (ref 98–110)
GLUCOSE: 94 mg/dL (ref 65–99)
Potassium: 4.2 mmol/L (ref 3.5–5.3)
SODIUM: 138 mmol/L (ref 135–146)
Total Protein: 7.2 g/dL (ref 6.1–8.1)

## 2015-06-07 LAB — TSH: TSH: 2.478 u[IU]/mL (ref 0.350–4.500)

## 2015-06-07 LAB — SEDIMENTATION RATE: Sed Rate: 1 mm/hr (ref 0–15)

## 2015-06-08 ENCOUNTER — Ambulatory Visit (INDEPENDENT_AMBULATORY_CARE_PROVIDER_SITE_OTHER): Payer: Commercial Managed Care - PPO | Admitting: Gastroenterology

## 2015-06-08 ENCOUNTER — Encounter: Payer: Self-pay | Admitting: Gastroenterology

## 2015-06-08 VITALS — BP 130/84 | HR 100 | Ht 71.0 in | Wt 227.0 lb

## 2015-06-08 DIAGNOSIS — K625 Hemorrhage of anus and rectum: Secondary | ICD-10-CM

## 2015-06-08 DIAGNOSIS — L290 Pruritus ani: Secondary | ICD-10-CM

## 2015-06-08 DIAGNOSIS — Z86010 Personal history of colonic polyps: Secondary | ICD-10-CM | POA: Diagnosis not present

## 2015-06-08 DIAGNOSIS — K6289 Other specified diseases of anus and rectum: Secondary | ICD-10-CM | POA: Diagnosis not present

## 2015-06-08 LAB — CULTURE, GROUP A STREP: Organism ID, Bacteria: NORMAL

## 2015-06-08 MED ORDER — CLOTRIMAZOLE 1 % EX CREA: 1.0000 "application " | TOPICAL_CREAM | Freq: Three times a day (TID) | CUTANEOUS | Status: DC

## 2015-06-08 NOTE — Patient Instructions (Addendum)
You can use baby wipes following bowel movements.   You can use over the counter Preporation H suppositories internally as needed for rectal bleeding.   We have sent the following medications to your pharmacy for you to pick up at your convenience:Lotrimin cream to use perianally three times a day x 2 weeks.   Thank you for choosing me and [facility].  Pricilla Riffle. Dagoberto Ligas., MD., Marval Regal

## 2015-06-08 NOTE — Progress Notes (Signed)
    History of Present Illness: This is a  49 year old male for the evaluation of anal itching, bleeding and pain. Symptoms have been bothersome almost on a daily basis for the past 6 months. He has no problems with abdominal pain, constipation or diarrhea. He notes anal and perianal itching and pain when wiping after bowel movements occasionally notes small amounts of bright red blood in his underwear and when wiping.  He was treated with  Topical nitroglycerin which did not improve his symptoms and lead to  severe headaches so this was discontinued. He underwent colonoscopy in 03/2011 that showed internal hemorrhoids, a few nonspecific ileal erosions and small adenomatous colon polyp.  Review of Systems: Pertinent positive and negative review of systems were noted in the above HPI section. All other review of systems were otherwise negative.  Current Medications, Allergies, Past Medical History, Past Surgical History, Family History and Social History were reviewed in [employer] record.  Physical Exam: General: Well developed, well nourished, no acute distress Head: Normocephalic and atraumatic Eyes:  sclerae anicteric, EOMI Ears: Normal auditory acuity Mouth: No deformity or lesions Neck: Supple, no masses or thyromegaly Lungs: Clear throughout to auscultation Heart: Regular rate and rhythm; no murmurs, rubs or bruits Abdomen: Soft, non tender and non distended. No masses, hepatosplenomegaly or hernias noted. Normal Bowel sounds Rectal:  Perianal erythema and several small fissures in the perianal skin. Anus appeared normal digital exam no tenderness or pain normal sphincter tone no lesions in the rectal vault Hemoccult-negative stool Musculoskeletal: Symmetrical with no gross deformities  Skin: No lesions on visible extremities Pulses:  Normal pulses noted Extremities: No clubbing, cyanosis, edema or deformities noted Neurological: Alert oriented x 4, grossly  nonfocal Cervical Nodes:  No significant cervical adenopathy Inguinal Nodes: No significant inguinal adenopathy Psychological:  Alert and cooperative. Anxious  Assessment and Recommendations:  1.  Anal, perianal itching and bleeding. History of internal hemorrhoids. Suspected perianal/anal Candida dermatitis. Lotrimin cream 3 times a day for 2 weeks. May use Preparation H suppositories daily as needed for internal hemorrhoids symptoms. Use baby wipes long-term post bowel movements. If symptoms do not resolve suggest dermatology consultation.  2.  Personal history of adenomatous colon polyps. Surveillance colonoscopy recommended August 2017.

## 2015-06-25 ENCOUNTER — Telehealth: Payer: Self-pay

## 2015-06-25 NOTE — Telephone Encounter (Signed)
Pt states Dr Tamala Julian wanted him to check back regarding his rash and it has come back. Please call [phone] .

## 2015-06-28 MED ORDER — TRAZODONE HCL 50 MG PO TABS: 50.0000 mg | ORAL_TABLET | Freq: Every evening | ORAL | Status: DC | PRN

## 2015-06-28 MED ORDER — PREDNISONE 20 MG PO TABS: ORAL_TABLET | ORAL | Status: DC

## 2015-06-28 MED ORDER — HYDROCORTISONE 2.5 % EX OINT: TOPICAL_OINTMENT | Freq: Two times a day (BID) | CUTANEOUS | Status: DC

## 2015-06-28 NOTE — Telephone Encounter (Signed)
The patient is scheduled for an appointment on December 15th at 9:50am with Dr Karma Ganja.  That was the earliest appointment they had.  The attending provider at the [facility] did not feel, based on the notes, that he needed an urgent appointment.  He does not need to arrive early to his appointment.  He only needs to bring his copay and copy of his insurance card.  Will notify patient.

## 2015-06-28 NOTE — Telephone Encounter (Signed)
According to the referral notes, the patient has an appointment scheduled at Dr Ledell Peoples office on 07-26-15.  [place] [place] did not enter an appointment time, so I don't know when he is scheduled that day.  He will have to call their office to inquire about the time and details on what he needs to bring to his appointment.

## 2015-06-28 NOTE — Telephone Encounter (Signed)
Call --- does he have an appointment yet with dermatology?  Would he like a refill of Prednisone; if yes, OK to refill.

## 2015-06-28 NOTE — Telephone Encounter (Signed)
CLINICAL TEAM:  1. Please advise patient that I have sent in Trazodone 50mg  for insomnia while taking Prednisone.  2. I have also sent in hydrocortisone ointment to apply to rash twice daily. I also recommend OTC benadryl gel to rash PRN.    REFERRALS TEAM: Please complete the referrals process for patient:  1.  Call Dr. Ledell Peoples office to determine time of appointment on 07-26-15 and see if they have an earlier appointment available for patient.  2. Find out what information patient will need to bring to appointment.  3. Contact patient with appointment time and date.

## 2015-06-28 NOTE — Telephone Encounter (Signed)
Left message for pt to call back  °

## 2015-06-28 NOTE — Telephone Encounter (Signed)
Spoke with pt, advised message. Rx for Prednisone sent to the pharmacy. He states the cream is not helping, is there another cream we can call in? He also states the Prednisone is helping him but it is causing him to not sleep well at night. Can he have something to help with this? (He mentioned Ambien). Referrals can we get an appt today for pt. He states he never got a call. Thanks

## 2015-06-29 NOTE — Telephone Encounter (Signed)
Spoke with pt, advised message and appt. Pt understood.

## 2016-02-13 ENCOUNTER — Encounter: Payer: Self-pay | Admitting: Gastroenterology

## 2019-03-18 ENCOUNTER — Ambulatory Visit: Payer: Commercial Managed Care - PPO | Admitting: Family Medicine

## 2019-04-08 ENCOUNTER — Encounter: Payer: Self-pay | Admitting: Family Medicine

## 2019-04-08 ENCOUNTER — Other Ambulatory Visit: Payer: Self-pay

## 2019-04-08 ENCOUNTER — Ambulatory Visit: Payer: Commercial Managed Care - PPO | Admitting: Family Medicine

## 2019-04-08 VITALS — BP 132/93 | HR 72 | Temp 98.0°F | Ht 70.25 in | Wt 185.5 lb

## 2019-04-08 DIAGNOSIS — N400 Enlarged prostate without lower urinary tract symptoms: Secondary | ICD-10-CM

## 2019-04-08 DIAGNOSIS — R059 Cough, unspecified: Secondary | ICD-10-CM

## 2019-04-08 DIAGNOSIS — L219 Seborrheic dermatitis, unspecified: Secondary | ICD-10-CM

## 2019-04-08 DIAGNOSIS — R21 Rash and other nonspecific skin eruption: Secondary | ICD-10-CM

## 2019-04-08 DIAGNOSIS — R030 Elevated blood-pressure reading, without diagnosis of hypertension: Secondary | ICD-10-CM

## 2019-04-08 DIAGNOSIS — G4700 Insomnia, unspecified: Secondary | ICD-10-CM

## 2019-04-08 DIAGNOSIS — R05 Cough: Secondary | ICD-10-CM

## 2019-04-08 DIAGNOSIS — N529 Male erectile dysfunction, unspecified: Secondary | ICD-10-CM | POA: Insufficient documentation

## 2019-04-08 LAB — SARS-COV-2 IGG: SARS-COV-2 IgG: 0.02

## 2019-04-08 MED ORDER — TADALAFIL 10 MG PO TABS: 10.0000 mg | ORAL_TABLET | Freq: Every day | ORAL | 5 refills | Status: DC

## 2019-04-08 NOTE — Assessment & Plan Note (Signed)
Having some retrograde ejaculation with Flomax.  Will stop today and start Cialis as noted above.  Discussed potential side effects.

## 2019-04-08 NOTE — Patient Instructions (Signed)
It was very nice to see you today!  Please try the cialis.  I will place a referral for you to see the dermatologist.  Come back in a few months for your physical, or sooner if needed.   Take care, Dr [name]  Please try these tips to maintain a healthy lifestyle:   Eat at least 3 REAL meals and 1-2 snacks per day.  Aim for no more than 5 hours between eating.  If you eat breakfast, please do so within one hour of getting up.    Obtain twice as many fruits/vegetables as protein or carbohydrate foods for both lunch and dinner. (Half of each meal should be fruits/vegetables, one quarter protein, and one quarter starchy carbs)   Cut down on sweet beverages. This includes juice, soda, and sweet tea.    Exercise at least 150 minutes every week.

## 2019-04-08 NOTE — Progress Notes (Signed)
Dr Marigene Ehlers interpretation of your lab work:  Your COVID antibody test is NEGATIVE. This means you have not been exposed to the virus and are still susceptible to infection.    If you have any additional questions, please give [place] a call or send [place] a message through [facility].  Take care, Dr [name]

## 2019-04-08 NOTE — Assessment & Plan Note (Signed)
Stop sildenafil.  Start Cialis 10 mg daily.  Discussed potential side effects.

## 2019-04-08 NOTE — Assessment & Plan Note (Signed)
Slightly above goal today.  Home blood pressure readings are at goal.  Continue home blood pressure monitoring with goal 140/90 or lower.  Discussed lifestyle modifications including low-salt diet and frequent cardio exercise.

## 2019-04-08 NOTE — Assessment & Plan Note (Signed)
Continue Ambien 10 mg nightly as needed.

## 2019-04-08 NOTE — Progress Notes (Signed)
Chief Complaint:  Joseph Gibbs is a 53 y.o. male who presents today with a chief complaint of rash and to establish care.   Assessment/Plan:  Rash Possibly consistent with rosacea.  Will place referral to dermatology per patient request.  Erectile dysfunction Stop sildenafil.  Start Cialis 10 mg daily.  Discussed potential side effects.  BPH (benign prostatic hyperplasia) Having some retrograde ejaculation with Flomax.  Will stop today and start Cialis as noted above.  Discussed potential side effects.  Insomnia Continue Ambien 10 mg nightly as needed.  Seborrheic dermatitis Continue topical clotrimazole-betamethasone as needed.  Elevated blood pressure reading Slightly above goal today.  Home blood pressure readings are at goal.  Continue home blood pressure monitoring with goal 140/90 or lower.  Discussed lifestyle modifications including low-salt diet and frequent cardio exercise.  Cough Patient with cough several months ago.  Associated with body aches as well.  Concern about possible COVID infection.  Check covid antibody.  No current symptoms.  Preventative Healthcare Patient was instructed to return soon for CPE.    Subjective:  HPI:  Rash Several year history.  Located on bilateral cheeks.  Was previously treated with topical cream with no improvement.  Recently was given antibiotics for urinary issue noticed improvement.  He would like to see a dermatologist for this.  His stable, chronic medical conditions are outlined below:  # BPH / ED - On flomax 0.4mg  daily  - Uses Sildenafil as needed for ED  # Seborrheic Dermatitis  - Located on scalp, ears, and face - Uses clobetasol as needed for recurrences  # Insomnia - Takes ambien 10mg  nightly as needed and tolerating well without side effects  # Recurrent Right Groin Discomfort - Has been seen by several specialists in the past with negative work up -Only flares up a few times per year.  Pain is  typically manageable.  ROS: Per HPI, otherwise a complete review of systems was negative.   PMH:  The following were reviewed and entered/updated in epic: Past Medical History:  Diagnosis Date  . Allergy   . Colon polyps   . Hypertension   . Low back pain   . Tubular adenoma of colon 03/2011   Patient Active Problem List   Diagnosis Date Noted  . BPH (benign prostatic hyperplasia) 04/08/2019  . Erectile dysfunction 04/08/2019  . Elevated blood pressure reading 04/08/2019  . OSA (obstructive sleep apnea) 09/07/2013  . Right groin pain 05/28/2012  . Seborrheic dermatitis 06/12/2008  . Insomnia 07/28/2007   Past Surgical History:  Procedure Laterality Date  . LUMBAR EPIDURAL INJECTION     [facility]    Family History  Problem Relation Age of Onset  . Breast cancer Mother 95  . Cancer Father        ? history of throat cancer  . Diabetes Father   . Alcohol abuse Father   . Diabetes Other   . Colon cancer Neg Hx    Medications- reviewed and updated Current Outpatient Medications  Medication Sig Dispense Refill  . clobetasol (OLUX) 0.05 % topical foam Apply topically.    . clotrimazole-betamethasone (LOTRISONE) cream APPLY TOPICALLY 2 (TWO) TIMES DAILY. 30 g 1  . zolpidem (AMBIEN) 10 MG tablet Take 10 mg by mouth at bedtime as needed for sleep.    . tadalafil (CIALIS) 10 MG tablet Take 1 tablet (10 mg total) by mouth daily. 30 tablet 5   No current facility-administered medications for this visit.  Allergies-reviewed and updated Allergies  Allergen Reactions  . Sulfonamide Derivatives     REACTION: Flushing and sweating  . Griseofulvin Nausea Only    Social History   Socioeconomic History  . Marital status: Married    Spouse name: Not on file  . Number of children: 1  . Years of education: Not on file  . Highest education level: Not on file  Occupational History  . Occupation: Fish farm manager business  Social Needs  . Financial  resource strain: Not on file  . Food insecurity    Worry: Not on file    Inability: Not on file  . Transportation needs    Medical: Not on file    Non-medical: Not on file  Tobacco Use  . Smoking status: Never Smoker  . Smokeless tobacco: Never Used  Substance and Sexual Activity  . Alcohol use: Yes    Alcohol/week: 5.0 standard drinks    Types: 5 Standard drinks or equivalent per week    Comment: few drinks a week  . Drug use: No  . Sexual activity: Not on file  Lifestyle  . Physical activity    Days per week: Not on file    Minutes per session: Not on file  . Stress: Not on file  Relationships  . Social Herbalist on phone: Not on file    Gets together: Not on file    Attends religious service: Not on file    Active member of club or organization: Not on file    Attends meetings of clubs or organizations: Not on file    Relationship status: Not on file  Other Topics Concern  . Not on file  Social History Narrative   Caffeine drinks daily but not soda          Objective:  Physical Exam: BP (!) 132/93   Pulse 72   Temp 98 F (36.7 C)   Ht 5' 10.25" (1.784 m)   Wt 185 lb 8 oz (84.1 kg)   SpO2 97%   BMI 26.43 kg/m   Gen: NAD, resting comfortably CV: Regular rate and rhythm with no murmurs appreciated Pulm: Normal work of breathing, clear to auscultation bilaterally with no crackles, wheezes, or rhonchi GI: Normal bowel sounds present. Soft, Nontender, Nondistended. MSK: No edema, cyanosis, or clubbing noted Skin: Warm, dry.  Erythematous patches on bilateral cheeks with scattered telangiectasias. Neuro: Grossly normal, moves all extremities Psych: Normal affect and thought content      Skylen Danielsen M. [name], MD 04/08/2019 11:51 AM

## 2019-04-08 NOTE — Assessment & Plan Note (Signed)
Continue topical clotrimazole-betamethasone as needed.

## 2019-04-11 ENCOUNTER — Other Ambulatory Visit: Payer: Self-pay

## 2019-04-11 MED ORDER — TADALAFIL 10 MG PO TABS: 10.0000 mg | ORAL_TABLET | Freq: Every day | ORAL | 5 refills | Status: DC

## 2019-06-17 ENCOUNTER — Ambulatory Visit: Payer: Self-pay

## 2019-06-17 NOTE — Telephone Encounter (Signed)
Patient called stating that he has just been golfing and now has small have type rash that covers his body head to groin. He states that his lip are swollen and numb feeling. He denies tongue swelling difficult breathing or swallowing. His symptoms started about 1.5 hours ago. He has taken benadryl PO. As we speak he states that his lips are improving. He has frequent reactions to perfumes. Care advice read to patient. He verbalized understanding. Office was called per patient request. Patient will go to UC for evaluation of symptoms.  Reason for Disposition . [1] MODERATE-SEVERE hives persist (i.e., hives interfere with normal activities or work) AND [2] taking antihistamine (e.g., Benadryl, Claritin) > 24 hours  Answer Assessment - Initial Assessment Questions 1. ONSET: "When did the swelling start?" (e.g., minutes, hours, days)     1 hour ago 2. SEVERITY: "How swollen is it?"     Lips numb and swollen 3. ITCHING: "Is there any itching?" If so, ask: "How much?"   (Scale 1-10; mild, moderate or severe)     6-7 4. PAIN: "Is the swelling painful to touch?" If so, ask: "How painful is it?"   (Scale 1-10; mild, moderate or severe)     no 5. CAUSE: "What do you think is causing the lip swelling?"     Unsure was golfing 6. RECURRENT SYMPTOM: "Have you had lip swelling before?" If so, ask: "When was the last time?" "What happened that time?"    never 7. OTHER SYMPTOMS: "Do you have any other symptoms?" (e.g., toothache)    hives 8. PREGNANCY: "Is there any chance you are pregnant?" "When was your last menstrual period?"     *No Answer*  Answer Assessment - Initial Assessment Questions 1. APPEARANCE: "What does the rash look like?"      Fine rash 2. LOCATION: "Where is the rash located?"     All over body except lower lega 3. NUMBER: "How many hives are there?"      All over 4. SIZE: "How big are the hives?" (inches, cm, compare to coins) "Do they all look the same or is there lots of  variation in shape and size?"     small 5. ONSET: "When did the hives begin?" (Hours or days ago)      1.5 hours 6. ITCHING: "Does it itch?" If so, ask: "How bad is the itch?"    - MILD: doesn't interfere with normal activities   - MODERATE-SEVERE: interferes with work, school, sleep, or other activities     6-7 7. RECURRENT PROBLEM: "Have you had hives before?" If so, ask: "When was the last time?" and "What happened that time?"      no 8. TRIGGERS: "Were you exposed to any new food, plant, cosmetic product or animal just before the hives began?"     [place] lotion 9. OTHER SYMPTOMS: "Do you have any other symptoms?" (e.g., fever, tongue swelling, difficulty breathing, abdominal pain)    Lips swelling 10. PREGNANCY: "Is there any chance you are pregnant?" "When was your last menstrual period?"      N/A  Protocols used: HIVES-A-AH, LIP SWELLING-A-AH

## 2019-06-17 NOTE — Telephone Encounter (Signed)
See below °FYI °

## 2019-06-20 NOTE — Telephone Encounter (Signed)
Noted. Agree with dispo.  [name]. [name], MD 06/20/2019 1:58 PM

## 2019-06-20 NOTE — Telephone Encounter (Signed)
FYI

## 2019-08-16 ENCOUNTER — Encounter: Payer: Self-pay | Admitting: Family Medicine

## 2019-10-17 ENCOUNTER — Ambulatory Visit: Payer: Self-pay

## 2019-10-17 ENCOUNTER — Other Ambulatory Visit: Payer: Self-pay

## 2019-10-17 ENCOUNTER — Encounter: Payer: Self-pay | Admitting: Family Medicine

## 2019-10-17 ENCOUNTER — Ambulatory Visit (INDEPENDENT_AMBULATORY_CARE_PROVIDER_SITE_OTHER): Payer: Commercial Managed Care - PPO

## 2019-10-17 ENCOUNTER — Ambulatory Visit: Payer: Commercial Managed Care - PPO | Admitting: Family Medicine

## 2019-10-17 VITALS — BP 114/78 | HR 75 | Ht 70.25 in | Wt 195.0 lb

## 2019-10-17 DIAGNOSIS — M25562 Pain in left knee: Secondary | ICD-10-CM | POA: Diagnosis not present

## 2019-10-17 DIAGNOSIS — M1712 Unilateral primary osteoarthritis, left knee: Secondary | ICD-10-CM

## 2019-10-17 DIAGNOSIS — M25511 Pain in right shoulder: Secondary | ICD-10-CM | POA: Diagnosis not present

## 2019-10-17 MED ORDER — PENNSAID 2 % EX SOLN: 1.0000 "application " | Freq: Two times a day (BID) | CUTANEOUS | 2 refills | Status: AC

## 2019-10-17 NOTE — Progress Notes (Signed)
Subjective:    CC: L medial knee pain  I, Molly Weber, LAT, ATC, am serving as scribe for Dr. Lynne Leader.  HPI: Pt is a 54 y/o male presenting w/ c/o L medial knee pain x 1-2 months w/ no specific MOI.  He states that he had started doing more resisted lower body exercises at the gym at that time so perhaps this contributed to his pain.  Pt rates his pain at a 5/10 and describes his pain as sharp and aching.  Radiating pain: No L knee swelling: No L knee mechanical symptoms: Yes in both knees but nothing new Aggravating symptoms: Rotation; pressure to the area like when sleeping on his side Treatments tried: Knee brace/sleeve.  Additionally he notes some right shoulder pain as well.  This is located at the superior shoulder and worse with overhead weight lifting.  It is mildly bothersome.  He denies any clicking or clunking or loss of function.  He denies severe pain.  No radiating pain weakness or numbness distally.  Pertinent review of Systems: No fevers or chills.  Relevant historical information: History of sleep apnea and elevated blood pressure in the past.   Objective:    Vitals:   10/17/19 0818  BP: 114/78  Pulse: 75  SpO2: 98%   General: Well Developed, well nourished, and in no acute distress.   MSK:  Right shoulder: Normal-appearing Normal motion. Tender palpation AC joint. Intact strength abduction external/internal motion. Negative empty can and Hawkins test.  Mildly positive Neer's test. Positive crossover compression test. Negative Yergason's and speeds test.  Contralateral left shoulder normal-appearing normal motion nontender normal strength.  Pulses cap refill and sensation are intact bilateral upper extremities.  Left knee: No effusion no erythema.  Small bump at anterior proximal tibia at insertion of patellar tendon. Range of motion 0-120 degrees with no significant crepitations. Tender palpation medial joint line nontender  otherwise. Intact strength to flexion and extension. Stable ligamentous exam. Positive medial McMurray's test negative lateral.   Right knee: No effusion no erythema.  Again small bump anterior proximal tibia insertion of patellar tendon. Range of motion 0-120 degrees with no crepitation. Nontender. Stable ligamentous exam. Negative McMurray's test. Intact strength.  Lab and Radiology Results  Diagnostic Limited MSK Ultrasound of: Left knee Quad tendon normal-appearing intact.  Minimal joint effusion present. Patellar tendon normal-appearing at origin.  Bony prominence at insertion onto the tibia with slight hyperechoic change at insertion. Lateral joint line normal-appearing Medial joint line slightly narrowed no definitive meniscus tear visible. Impression: Medial compartment DJD possible medial meniscus tear.  X-ray images left knee obtained today personally and independently reviewed No acute fractures.  Minimal degenerative changes. Await formal radiology review  Impression and Recommendations:    Assessment and Plan: 54 y.o. male with  Left knee pain ongoing for about 2 months.  Concerning for medial meniscus injury.  Additionally patient does have some osteoarthritic changes of the knee.  Discussed options.  Plan for Pennsaid versus OTC Voltaren gel.  Reasonable to continue compression brace.  I do not think he is getting get much benefit from hinged knee brace. Next step if not better would be injection versus MRI.  Right shoulder pain: AC joint origin.  This was a lesser issue discussed towards the end of the visit.  Plan for modification of weight lifting activity to avoid pressure to [facility] joint. Additionally will recommend Voltaren gel.  If not better follow-up.  Recheck back in about 1 month.  PDMP  not reviewed this encounter. Orders Placed This Encounter  Procedures  . [place] LIMITED JOINT SPACE STRUCTURES LOW LEFT(NO LINKED CHARGES)    Order Specific Question:    Reason for Exam (SYMPTOM  OR DIAGNOSIS REQUIRED)    Answer:   left knee pain    Order Specific Question:   Preferred imaging location?    Answer:   [facility]  . DG Knee 4 Views W/Patella Left    Standing Status:   Future    Number of Occurrences:   1    Standing Expiration Date:   12/16/2020    Order Specific Question:   Reason for Exam (SYMPTOM  OR DIAGNOSIS REQUIRED)    Answer:   eval knee pain left    Order Specific Question:   Preferred imaging location?    Answer:   [facility]    Order Specific Question:   Radiology Contrast Protocol - do NOT remove file path    Answer:   [email or web address]   Meds ordered this encounter  Medications  . Diclofenac Sodium (PENNSAID) 2 % SOLN    Sig: Place 1 application onto the skin 2 (two) times daily.    Dispense:  112 g    Refill:  2    Home Phone      [phone] Mobile          [phone]     Discussed warning signs or symptoms. Please see discharge instructions. Patient expresses understanding.   The above documentation has been reviewed and is accurate and complete Lynne Leader

## 2019-10-17 NOTE — Patient Instructions (Addendum)
Thank you for coming in today. I will send the prescription for pensaid.  I think you have a medial mensicus injury.  I think you also have issue at [facility] joint.  If not better let me know.  Get xray on your way out.   Pennsaid instructions: You have been given a sample/prescription for Pennsaid, a topical medication.     You are to apply this gel to your injured body part twice daily (morning and evening).   A little goes a long way so you can use about a pea-sized amount for each area.   Spread this small amount over the area into a thin film and let it dry.   Be sure that you do not rub the gel into your skin for more than 10 or 15 seconds otherwise it can irritate you skin.    Once you apply the gel, please do not put any other lotion or clothing in contact with that area for 30 minutes to allow the gel to absorb into your skin.   Some people are sensitive to the medication and can develop a sunburn-like rash.  If you have only mild symptoms it is okay to continue to use the medication but if you have any breakdown of your skin you should discontinue its use and please let us know.   If you have been written a prescription for Pennsaid, you will receive a pump bottle of this topical gel through a mail order pharmacy.  The instructions on the bottle will say to apply two pumps twice a day which may be too much gel for your particular area so use the pea-sized amount as your guide.    Meniscus Tear  A meniscus tear is a knee injury that happens when a piece of the meniscus is torn. The meniscus is a thick, rubbery, wedge-shaped cartilage in the knee. Two menisci are located in each knee. They sit between the upper bone (femur) and lower bone (tibia) that make up the knee joint. Each meniscus acts as a shock absorber for the knee. A torn meniscus is one of the most common types of knee injuries. This injury can range from mild to severe. Surgery may be needed to repair a severe  tear. What are the causes? This condition may be caused by any kneeling, squatting, twisting, or pivoting movement. Sports-related injuries are the most common cause. These often occur from:  Running and stopping suddenly. ? Changing direction. ? Being tackled or knocked off your feet.  Lifting or carrying heavy weights. As people get older, their menisci get thinner and weaker. In these people, tears can happen more easily, such as from climbing stairs. What increases the risk? You are more likely to develop this condition if you:  Play contact sports.  Have a job that requires kneeling or squatting.  Are male.  Are over 69 years old. What are the signs or symptoms? Symptoms of this condition include:  Knee pain, especially at the side of the knee joint. You may feel pain when the injury occurs, or you may only hear a pop and feel pain later.  A feeling that your knee is clicking, catching, locking, or giving way.  Not being able to fully bend or extend your knee.  Bruising or swelling in your knee. How is this diagnosed? This condition may be diagnosed based on your symptoms and a physical exam. You may also have tests, such as:  X-rays.  MRI.  A procedure to look  inside your knee with a narrow surgical telescope (arthroscopy). You may be referred to a knee specialist (orthopedic surgeon). How is this treated? Treatment for this injury depends on the severity of the tear. Treatment for a mild tear may include:  Rest.  Medicine to reduce pain and swelling. This is usually a nonsteroidal anti-inflammatory drug (NSAID), like ibuprofen.  A knee brace, sleeve, or wrap.  Using crutches or a walker to keep weight off your knee and to help you walk.  Exercises to strengthen your knee (physical therapy). You may need surgery if you have a severe tear or if other treatments are not working. Follow these instructions at home: If you have a brace, sleeve, or wrap:  Wear  it as told by your health care provider. Remove it only as told by your health care provider.  Loosen the brace, sleeve, or wrap if your toes tingle, become numb, or turn cold and blue.  Keep the brace, sleeve, or wrap clean and dry.  If the brace, sleeve, or wrap is not waterproof: ? Do not let it get wet. ? Cover it with a watertight covering when you take a bath or shower. Managing pain and swelling   Take over-the-counter and prescription medicines only as told by your health care provider.  If directed, put ice on your knee: ? If you have a removable brace, sleeve, or wrap, remove it as told by your health care provider. ? Put ice in a plastic bag. ? Place a towel between your skin and the bag. ? Leave the ice on for 20 minutes, 2-3 times per day.  Move your toes often to avoid stiffness and to lessen swelling.  Raise (elevate) the injured area above the level of your heart while you are sitting or lying down. Activity  Do not use the injured limb to support your body weight until your health care provider says that you can. Use crutches or a walker as told by your health care provider.  Return to your normal activities as told by your health care provider. Ask your health care provider what activities are safe for you.  Perform range-of-motion exercises only as told by your health care provider.  Begin doing exercises to strengthen your knee and leg muscles only as told by your health care provider. After you recover, your health care provider may recommend these exercises to help prevent another injury. General instructions  Use a knee brace, sleeve, or wrap as told by your health care provider.  Ask your health care provider when it is safe to drive if you have a brace, sleeve, or wrap on your knee.  Do not use any products that contain nicotine or tobacco, such as cigarettes, e-cigarettes, and chewing tobacco. If you need help quitting, ask your health care  provider.  Ask your health care provider if the medicine prescribed to you: ? Requires you to avoid driving or using heavy machinery. ? Can cause constipation. You may need to take these actions to prevent or treat constipation:  Drink enough fluid to keep your urine pale yellow.  Take over-the-counter or prescription medicines.  Eat foods that are high in fiber, such as beans, whole grains, and fresh fruits and vegetables.  Limit foods that are high in fat and processed sugars, such as fried or sweet foods.  Keep all follow-up visits as told by your health care provider. This is important. Contact a health care provider if:  You have a fever.  Your knee  becomes red, tender, or swollen.  Your pain medicine is not helping.  Your symptoms get worse or do not improve after 2 weeks of home care. Summary  A meniscus tear is a knee injury that happens when a piece of the meniscus is torn.  Treatment for this injury depends on the severity of the tear. You may need surgery if you have a severe tear or if other treatments are not working.  Rest, ice, and raise (elevate) your injured knee as told by your health care provider. This will help lessen pain and swelling.  Contact a health care provider if you have new symptoms, or your symptoms get worse or do not improve after 2 weeks of home care.  Keep all follow-up visits as told by your health care provider. This is important. This information is not intended to replace advice given to you by your health care provider. Make sure you discuss any questions you have with your health care provider. Document Revised: 02/09/2018 Document Reviewed: 02/09/2018 Elsevier Patient Education  2020 Elsevier Inc[employer]

## 2019-10-17 NOTE — Progress Notes (Signed)
X-ray left knee normal per radiology.

## 2019-11-03 ENCOUNTER — Ambulatory Visit: Payer: Commercial Managed Care - PPO | Attending: Internal Medicine

## 2019-11-03 DIAGNOSIS — Z23 Encounter for immunization: Secondary | ICD-10-CM

## 2019-11-03 NOTE — Progress Notes (Signed)
   Covid-19 Vaccination Clinic  Name:  Joseph Gibbs    MRN: [id number] DOB: 1965-12-09  11/03/2019  [name] was observed post Covid-19 immunization for 15 minutes without incident. He was provided with Vaccine Information Sheet and instruction to access the V-Safe system.   [name] was instructed to call 911 with any severe reactions post vaccine: Marland Kitchen Difficulty breathing  . Swelling of face and throat  . A fast heartbeat  . A bad rash all over body  . Dizziness and weakness   Immunizations Administered    Name Date Dose VIS Date Route   Pfizer COVID-19 Vaccine 11/03/2019 11:38 AM 0.3 mL 07/22/2019 Intramuscular   Manufacturer: [place]   Lot: [id number]   [place]: [id number]

## 2019-11-21 ENCOUNTER — Ambulatory Visit: Payer: Commercial Managed Care - PPO | Admitting: Physician Assistant

## 2019-11-22 ENCOUNTER — Encounter: Payer: Self-pay | Admitting: Family Medicine

## 2019-11-22 ENCOUNTER — Other Ambulatory Visit: Payer: Self-pay

## 2019-11-22 ENCOUNTER — Ambulatory Visit: Payer: Commercial Managed Care - PPO | Admitting: Family Medicine

## 2019-11-22 VITALS — BP 130/88 | HR 87 | Temp 98.2°F | Ht 70.25 in | Wt 193.0 lb

## 2019-11-22 DIAGNOSIS — G4700 Insomnia, unspecified: Secondary | ICD-10-CM

## 2019-11-22 DIAGNOSIS — K602 Anal fissure, unspecified: Secondary | ICD-10-CM | POA: Diagnosis not present

## 2019-11-22 DIAGNOSIS — N529 Male erectile dysfunction, unspecified: Secondary | ICD-10-CM

## 2019-11-22 MED ORDER — NITROGLYCERIN 0.4 % RE OINT: TOPICAL_OINTMENT | RECTAL | 1 refills | Status: AC

## 2019-11-22 MED ORDER — ZOLPIDEM TARTRATE 10 MG PO TABS: 10.0000 mg | ORAL_TABLET | Freq: Every evening | ORAL | 5 refills | Status: DC | PRN

## 2019-11-22 MED ORDER — TADALAFIL 10 MG PO TABS: 10.0000 mg | ORAL_TABLET | Freq: Every day | ORAL | 5 refills | Status: DC

## 2019-11-22 NOTE — Assessment & Plan Note (Addendum)
We will continue Cialis 10 mg daily.  Doing well with this.

## 2019-11-22 NOTE — Patient Instructions (Signed)
It was very nice to see you today!  Please start the nitroglycerin ointment.  You can use the Dermoplast as needed to help control the pain and itching.  Try increasing your dietary fiber.  You can use Benefiber supplements if needed.  Letter know if not improving or worsening over the next couple of weeks.  I will refill your Cialis and Ambien today as well.  Take care, Dr [name]  Please try these tips to maintain a healthy lifestyle:   Eat at least 3 REAL meals and 1-2 snacks per day.  Aim for no more than 5 hours between eating.  If you eat breakfast, please do so within one hour of getting up.    Each meal should contain half fruits/vegetables, one quarter protein, and one quarter carbs (no bigger than a computer mouse)   Cut down on sweet beverages. This includes juice, soda, and sweet tea.     Drink at least 1 glass of water with each meal and aim for at least 8 glasses per day   Exercise at least 150 minutes every week.

## 2019-11-22 NOTE — Progress Notes (Signed)
   Joseph Gibbs is a 54 y.o. male who presents today for an office visit.  Assessment/Plan:  Chronic Problems Addressed Today: Erectile dysfunction We will continue Cialis 10 mg daily.  Doing well with this.  Insomnia Stable.  Will refill Ambien.  Discussed potential side effects.  New Issues: Anal fissure Exam deferred today.  Patient had photos with him which demonstrated small tear and mucosal membrane on anal verge.  Also with a few small hemorrhoids noted.  Had extensive discussion with patient regarding conservative management including good oral hydration, diet high in fiber with supplementation as needed, and continuing sitz bath's.  We will also start topical nitroglycerin.  Also recommended over-the-counter Dermoplast as needed to control symptoms.  If no improvement in the next couple weeks will need referral to GI.    Subjective:  HPI:  Patient here for 3 weeks of rectal pain.  Has had similar episode in the past and was diagnosed with anal fissure.  He has been under quite a bit of stress recently due to his work.  He is also had some changes into his diet recently.  Is recently started back on a keto diet.  Has pain all the time.  Worse with bowel movements.  No bleeding.  He took a picture of the area and noticed a cut on his pants.  He is also noted a few hemorrhoids.  No past this is resolved with change in diet and sitz baths.  He has been trying these things and symptoms have improved modestly.         Objective:  Physical Exam: BP 130/88 (BP Location: Left Arm, Patient Position: Sitting, Cuff Size: Normal)   Pulse 87   Temp 98.2 F (36.8 C) (Temporal)   Ht 5' 10.25" (1.784 m)   Wt 193 lb (87.5 kg)   SpO2 98%   BMI 27.50 kg/m   Gen: No acute distress, resting comfortably Neuro: Grossly normal, moves all extremities Psych: Normal affect and thought content  Time Spent: 45 minutes of total time was spent on the date of the encounter performing the  following actions: chart review prior to seeing the patient, obtaining history, performing a medically necessary exam, counseling on the treatment plan, placing orders, and documenting in our EHR.        [name]. [name], MD 11/22/2019 3:51 PM

## 2019-11-22 NOTE — Assessment & Plan Note (Signed)
Stable.  Will refill Ambien.  Discussed potential side effects.

## 2019-11-28 ENCOUNTER — Ambulatory Visit: Payer: Commercial Managed Care - PPO | Attending: Internal Medicine

## 2019-11-28 DIAGNOSIS — Z23 Encounter for immunization: Secondary | ICD-10-CM

## 2019-11-28 NOTE — Progress Notes (Signed)
   Covid-19 Vaccination Clinic  Name:  Joseph Gibbs    MRN: [id number] DOB: 1966-07-11  11/28/2019  [name] was observed post Covid-19 immunization for 15 minutes without incident. He was provided with Vaccine Information Sheet and instruction to access the V-Safe system.   [name] was instructed to call 911 with any severe reactions post vaccine: Marland Kitchen Difficulty breathing  . Swelling of face and throat  . A fast heartbeat  . A bad rash all over body  . Dizziness and weakness   Immunizations Administered    Name Date Dose VIS Date Route   Pfizer COVID-19 Vaccine 11/28/2019  9:25 AM 0.3 mL 10/05/2018 Intramuscular   Manufacturer: [place]   Lot: [id number]   [place]: [id number]

## 2020-05-28 ENCOUNTER — Ambulatory Visit: Payer: Commercial Managed Care - PPO | Admitting: Family Medicine

## 2020-05-28 ENCOUNTER — Encounter: Payer: Self-pay | Admitting: Family Medicine

## 2020-05-28 ENCOUNTER — Ambulatory Visit: Payer: Self-pay

## 2020-05-28 ENCOUNTER — Other Ambulatory Visit: Payer: Self-pay

## 2020-05-28 VITALS — BP 132/86 | HR 90 | Ht 70.25 in | Wt 194.0 lb

## 2020-05-28 DIAGNOSIS — M25561 Pain in right knee: Secondary | ICD-10-CM

## 2020-05-28 NOTE — Patient Instructions (Signed)
Thank you for coming in today.  Please use voltaren gel up to 4x daily for pain as needed.  Or use pensiad 2x daily  I fear a meniscus tear. Use the knee brace as needed.   If not improving next step is injection.  If still not better will plan for xray and MRI.    Meniscus Tear  A meniscus tear is a knee injury that happens when a piece of the meniscus is torn. The meniscus is a thick, rubbery, wedge-shaped cartilage in the knee. Two menisci are located in each knee. They sit between the upper bone (femur) and lower bone (tibia) that make up the knee joint. Each meniscus acts as a shock absorber for the knee. A torn meniscus is one of the most common types of knee injuries. This injury can range from mild to severe. Surgery may be needed to repair a severe tear. What are the causes? This condition may be caused by any kneeling, squatting, twisting, or pivoting movement. Sports-related injuries are the most common cause. These often occur from:  Running and stopping suddenly. ? Changing direction. ? Being tackled or knocked off your feet.  Lifting or carrying heavy weights. As people get older, their menisci get thinner and weaker. In these people, tears can happen more easily, such as from climbing stairs. What increases the risk? You are more likely to develop this condition if you:  Play contact sports.  Have a job that requires kneeling or squatting.  Are male.  Are over 11 years old. What are the signs or symptoms? Symptoms of this condition include:  Knee pain, especially at the side of the knee joint. You may feel pain when the injury occurs, or you may only hear a pop and feel pain later.  A feeling that your knee is clicking, catching, locking, or giving way.  Not being able to fully bend or extend your knee.  Bruising or swelling in your knee. How is this diagnosed? This condition may be diagnosed based on your symptoms and a physical exam. You may also have  tests, such as:  X-rays.  MRI.  A procedure to look inside your knee with a narrow surgical telescope (arthroscopy). You may be referred to a knee specialist (orthopedic surgeon). How is this treated? Treatment for this injury depends on the severity of the tear. Treatment for a mild tear may include:  Rest.  Medicine to reduce pain and swelling. This is usually a nonsteroidal anti-inflammatory drug (NSAID), like ibuprofen.  A knee brace, sleeve, or wrap.  Using crutches or a walker to keep weight off your knee and to help you walk.  Exercises to strengthen your knee (physical therapy). You may need surgery if you have a severe tear or if other treatments are not working. Follow these instructions at home: If you have a brace, sleeve, or wrap:  Wear it as told by your health care provider. Remove it only as told by your health care provider.  Loosen the brace, sleeve, or wrap if your toes tingle, become numb, or turn cold and blue.  Keep the brace, sleeve, or wrap clean and dry.  If the brace, sleeve, or wrap is not waterproof: ? Do not let it get wet. ? Cover it with a watertight covering when you take a bath or shower. Managing pain and swelling   Take over-the-counter and prescription medicines only as told by your health care provider.  If directed, put ice on your knee: ? If  you have a removable brace, sleeve, or wrap, remove it as told by your health care provider. ? Put ice in a plastic bag. ? Place a towel between your skin and the bag. ? Leave the ice on for 20 minutes, 2-3 times per day.  Move your toes often to avoid stiffness and to lessen swelling.  Raise (elevate) the injured area above the level of your heart while you are sitting or lying down. Activity  Do not use the injured limb to support your body weight until your health care provider says that you can. Use crutches or a walker as told by your health care provider.  Return to your normal  activities as told by your health care provider. Ask your health care provider what activities are safe for you.  Perform range-of-motion exercises only as told by your health care provider.  Begin doing exercises to strengthen your knee and leg muscles only as told by your health care provider. After you recover, your health care provider may recommend these exercises to help prevent another injury. General instructions  Use a knee brace, sleeve, or wrap as told by your health care provider.  Ask your health care provider when it is safe to drive if you have a brace, sleeve, or wrap on your knee.  Do not use any products that contain nicotine or tobacco, such as cigarettes, e-cigarettes, and chewing tobacco. If you need help quitting, ask your health care provider.  Ask your health care provider if the medicine prescribed to you: ? Requires you to avoid driving or using heavy machinery. ? Can cause constipation. You may need to take these actions to prevent or treat constipation:  Drink enough fluid to keep your urine pale yellow.  Take over-the-counter or prescription medicines.  Eat foods that are high in fiber, such as beans, whole grains, and fresh fruits and vegetables.  Limit foods that are high in fat and processed sugars, such as fried or sweet foods.  Keep all follow-up visits as told by your health care provider. This is important. Contact a health care provider if:  You have a fever.  Your knee becomes red, tender, or swollen.  Your pain medicine is not helping.  Your symptoms get worse or do not improve after 2 weeks of home care. Summary  A meniscus tear is a knee injury that happens when a piece of the meniscus is torn.  Treatment for this injury depends on the severity of the tear. You may need surgery if you have a severe tear or if other treatments are not working.  Rest, ice, and raise (elevate) your injured knee as told by your health care provider. This  will help lessen pain and swelling.  Contact a health care provider if you have new symptoms, or your symptoms get worse or do not improve after 2 weeks of home care.  Keep all follow-up visits as told by your health care provider. This is important. This information is not intended to replace advice given to you by your health care provider. Make sure you discuss any questions you have with your health care provider. Document Revised: 02/09/2018 Document Reviewed: 02/09/2018 Elsevier Patient Education  [place].

## 2020-05-28 NOTE — Progress Notes (Signed)
I, Joseph Gibbs, LAT, ATC, am serving as scribe for Dr. Lynne Leader.  Joseph Gibbs is a 54 y.o. male who presents to [place] at [facility] today for R knee pain.  He was last seen by Dr. Georgina Snell on 10/17/19 for his L knee and R shoulder and was prescribed Pennsaid.  Since then, pt reports R knee pain x 2 weeks.  He notes clicking and popping in his R knee.  He played golf this past Sat and felt a large pop in his knee after striking the golf ball.  He has been icing his knee and purchased a knee brace.  He is lacking R knee AROM.  He locates his pain to his R medial knee.  Aggravating factors include R knee flexion/extension AROM and weight-bearing activity.   Pertinent review of systems: No fevers or chills  Relevant historical information: Sleep apnea   Exam:  BP 132/86 (BP Location: Right Arm, Patient Position: Sitting, Cuff Size: Large)   Pulse 90   Ht 5' 10.25" (1.784 m)   Wt 194 lb (88 kg)   SpO2 98%   BMI 27.64 kg/m  General: Well Developed, well nourished, and in no acute distress.   MSK: Right knee moderate effusion otherwise normal-appearing Normal motion with crepitation. Tender palpation medial joint line. Stable ligamentous exam however somewhat limited by guarding. Positive medial McMurray's test. Intact strength. Antalgic gait.    Lab and Radiology Results  Procedure: Real-time Ultrasound Guided Injection of right knee superior lateral patellar space Device: Philips Affiniti 50G Images permanently stored and available for review in PACS Ultrasound examination of knee prior to injection reveals a moderate effusion.  Significantly abnormal medial meniscus with evidence of tear.  No Baker's cyst. Verbal informed consent obtained.  Discussed risks and benefits of procedure. Warned about infection bleeding damage to structures skin hypopigmentation and fat atrophy among others. Patient expresses understanding and agreement Time-out  conducted.   Noted no overlying erythema, induration, or other signs of local infection.   Skin prepped in a sterile fashion.   Local anesthesia: Topical Ethyl chloride.   With sterile technique and under real time ultrasound guidance:  40 mg of Kenalog and 2 mL of Marcaine injected into joint. Fluid seen entering the joint capsule.   Completed without difficulty   Pain immediately resolved suggesting accurate placement of the medication.   Advised to call if fevers/chills, erythema, induration, drainage, or persistent bleeding.   Images permanently stored and available for review in the ultrasound unit.  Impression: Technically successful ultrasound guided injection.   Assessment and Plan: 54 y.o. male with right knee pain occurring while playing golf with a twisting motion.  Concern for medial meniscus tear.  Ultrasound examination patient has an abnormal appearing medial meniscus that is consistent with tear.  Is possible he may also have a more serious injury in his knee such as ACL injury however no severe laxity felt with exam testing today.  This was somewhat limited by guarding however.  Plan for steroid injection as above and Pennsaid or Voltaren gel.  If not improved will proceed with x-ray and likely MRI to further characterize knee pain and for surgical planning.   PDMP not reviewed this encounter. Orders Placed This Encounter  Procedures  . [place] LIMITED JOINT SPACE STRUCTURES LOW RIGHT(NO LINKED CHARGES)    Order Specific Question:   Reason for Exam (SYMPTOM  OR DIAGNOSIS REQUIRED)    Answer:   R medial knee pain  Order Specific Question:   Preferred imaging location?    Answer:   [place]   No orders of the defined types were placed in this encounter.    Discussed warning signs or symptoms. Please see discharge instructions. Patient expresses understanding.   The above documentation has been reviewed and is accurate and complete Lynne Leader,  M.D.

## 2020-07-23 ENCOUNTER — Encounter: Payer: Self-pay | Admitting: Family Medicine

## 2020-07-25 MED ORDER — CLOBETASOL PROPIONATE 0.05 % EX FOAM: Freq: Two times a day (BID) | CUTANEOUS | 5 refills | Status: AC

## 2020-07-25 MED ORDER — CLOTRIMAZOLE-BETAMETHASONE 1-0.05 % EX CREA: TOPICAL_CREAM | CUTANEOUS | 1 refills | Status: AC

## 2020-07-25 MED ORDER — ZOLPIDEM TARTRATE 10 MG PO TABS: 10.0000 mg | ORAL_TABLET | Freq: Every evening | ORAL | 5 refills | Status: AC | PRN

## 2020-07-25 NOTE — Telephone Encounter (Signed)
Ok to refill 

## 2020-09-18 IMAGING — DX DG KNEE COMPLETE 4+V*L*
4 series · 4 of 4 positions shown · non-contrast
Comparison: None.

CLINICAL DATA: 53-year-old male with a history of left knee pain
for 1 month

EXAM:
LEFT KNEE - COMPLETE 4+ VIEW

[knee ap]
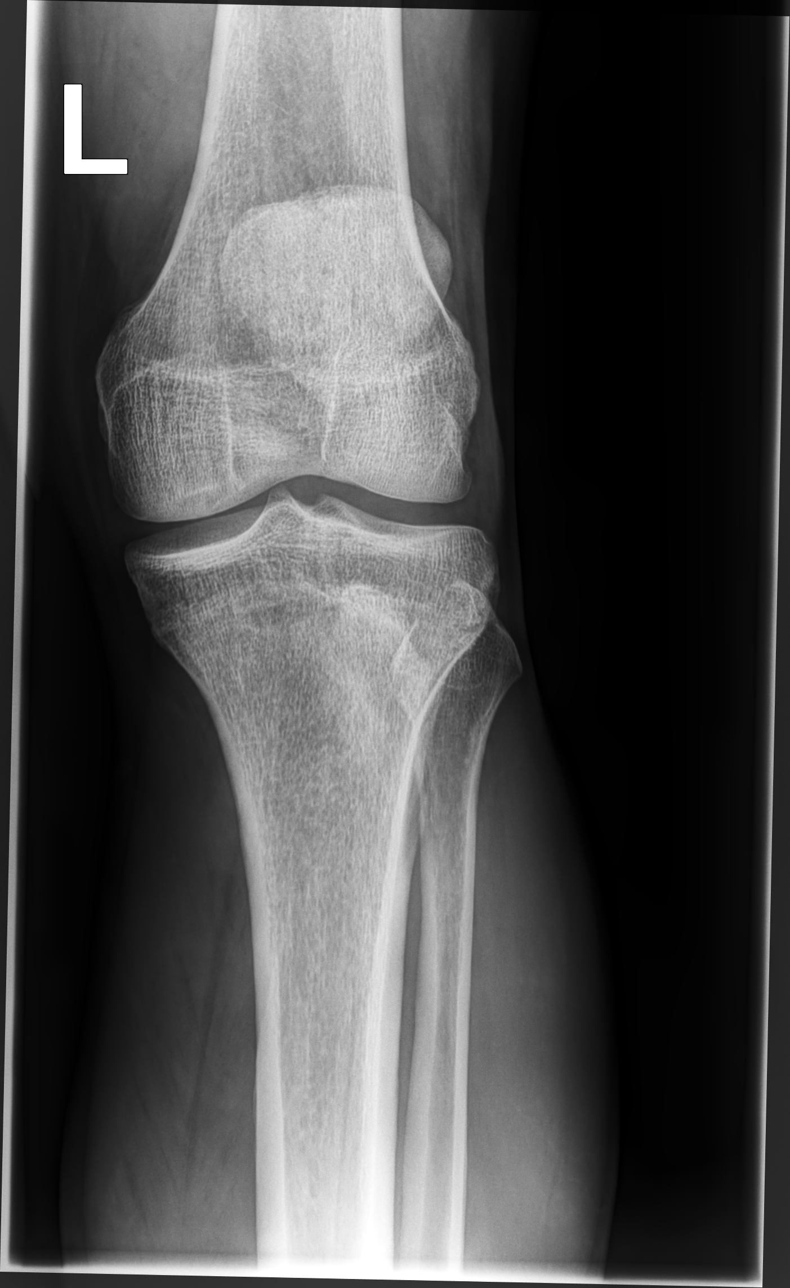

[knee lat]
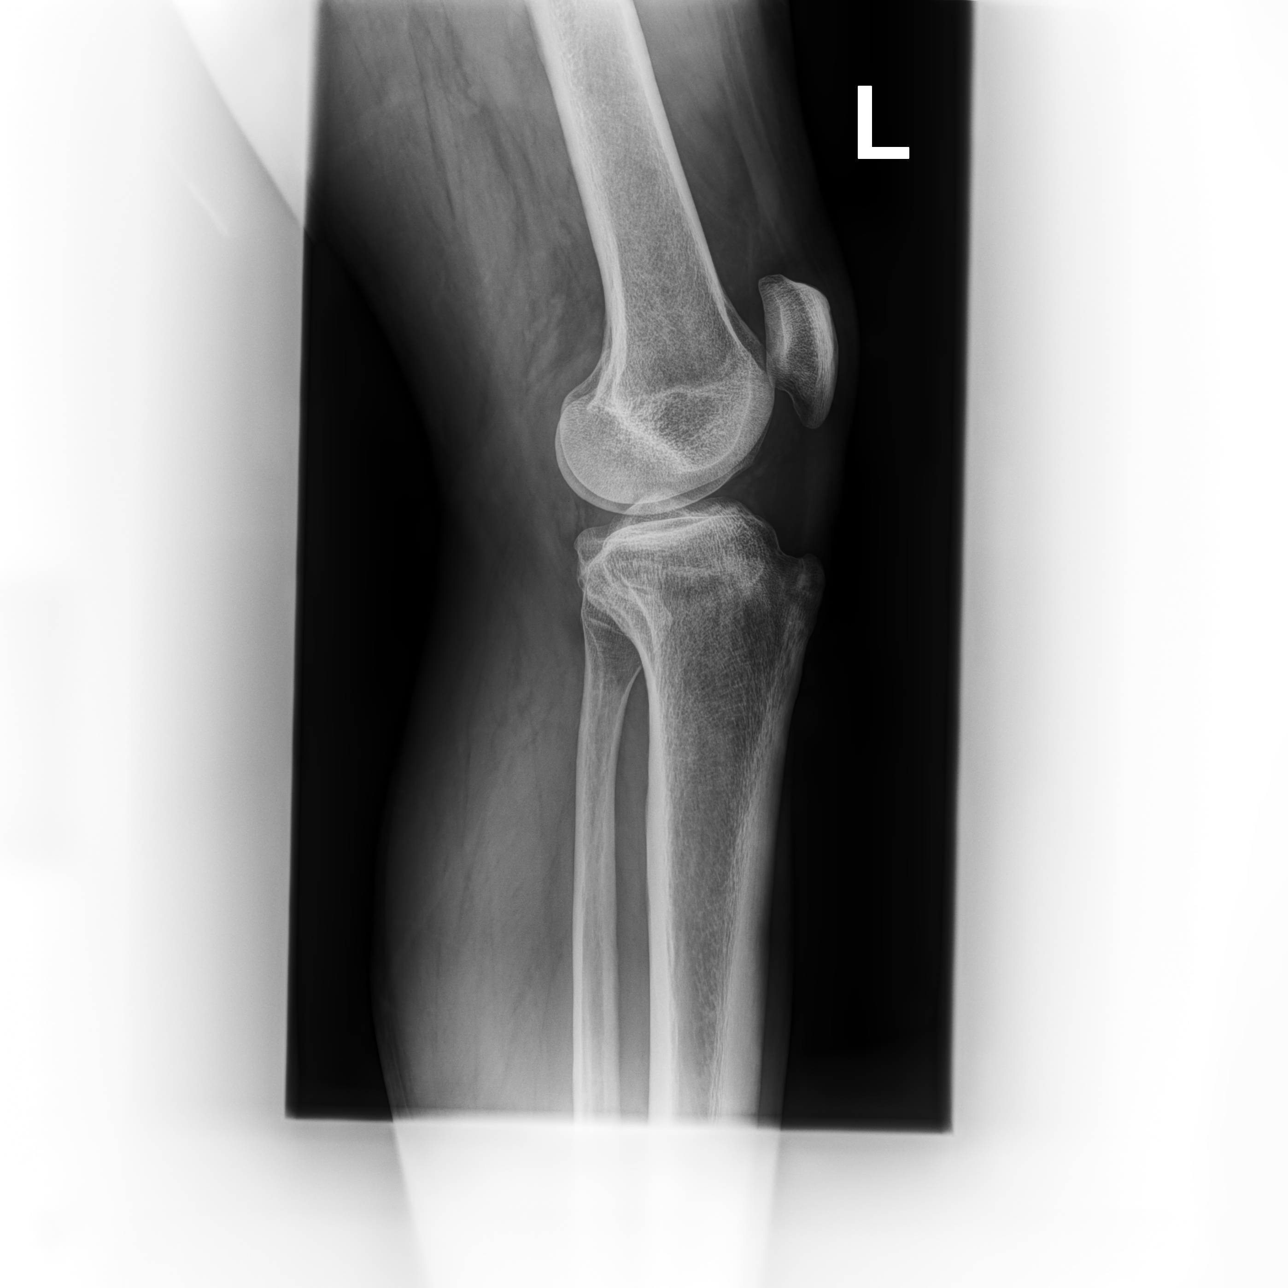

[knee (tunnel view) pa]
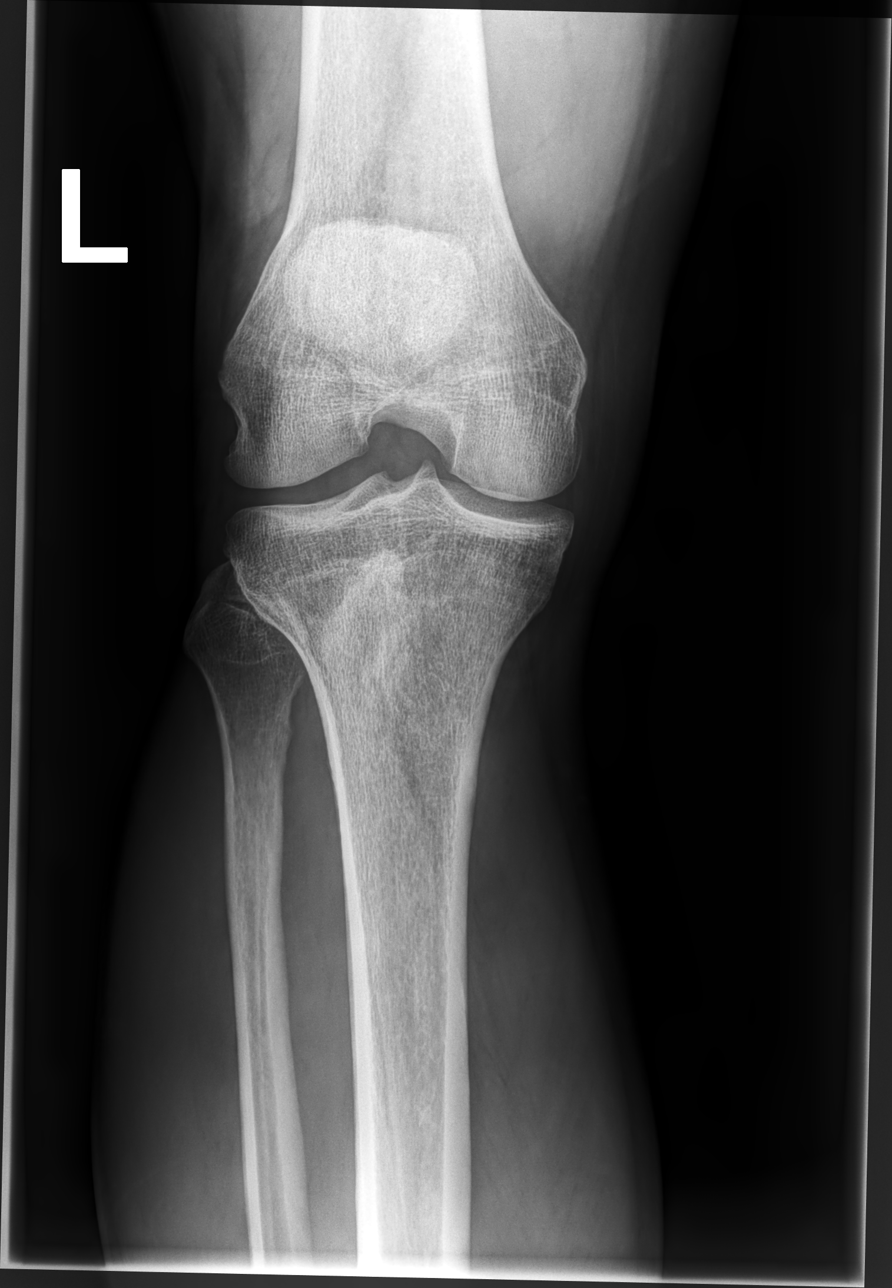

[patella (sunrise) tan]
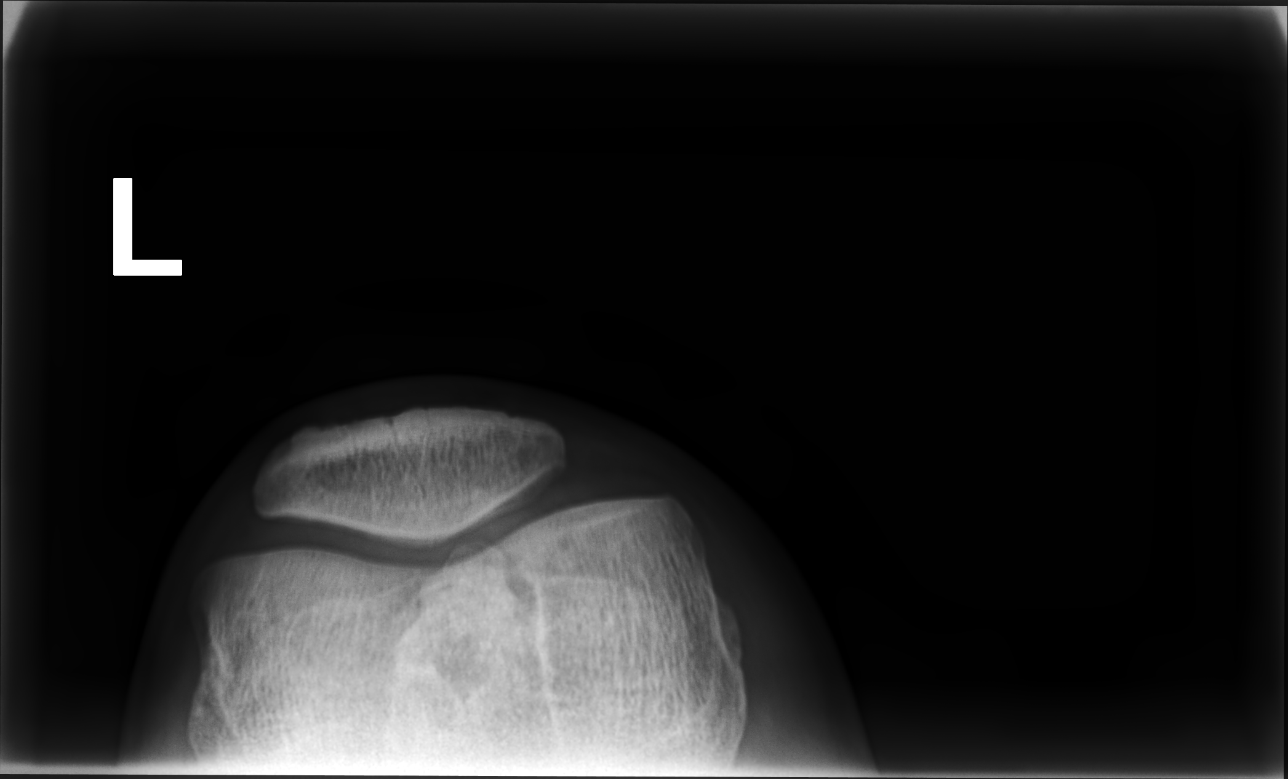

[4 of 4 positions shown; findings below may reference images not displayed]

FINDINGS: No evidence of fracture, dislocation, or joint effusion. No evidence
of arthropathy or other focal bone abnormality. Soft tissues are
unremarkable.
IMPRESSION: Negative for acute bony abnormality

## 2020-10-01 ENCOUNTER — Other Ambulatory Visit: Payer: Self-pay | Admitting: Family Medicine

## 2020-11-02 ENCOUNTER — Other Ambulatory Visit: Payer: Self-pay | Admitting: Family Medicine

## 2020-12-01 ENCOUNTER — Other Ambulatory Visit: Payer: Self-pay | Admitting: Family Medicine

## 2021-01-05 ENCOUNTER — Other Ambulatory Visit: Payer: Self-pay | Admitting: Family Medicine

## 2021-02-09 ENCOUNTER — Other Ambulatory Visit: Payer: Self-pay | Admitting: Family Medicine

## 2021-03-18 ENCOUNTER — Other Ambulatory Visit: Payer: Self-pay | Admitting: Family Medicine

## 2021-04-18 ENCOUNTER — Other Ambulatory Visit: Payer: Self-pay | Admitting: Family Medicine

## 2021-05-21 ENCOUNTER — Other Ambulatory Visit: Payer: Self-pay | Admitting: Family Medicine

## 2021-06-30 ENCOUNTER — Other Ambulatory Visit: Payer: Self-pay | Admitting: Family Medicine

## 2022-05-05 ENCOUNTER — Encounter: Payer: Self-pay | Admitting: *Deleted

## 2022-07-24 ENCOUNTER — Encounter: Payer: Self-pay | Admitting: *Deleted
